# Patient Record
Sex: Male | Born: 1970 | Race: Black or African American | Hispanic: No | Smoking: Never smoker
Health system: Southern US, Community
[De-identification: ages and names within clinical notes are randomized; demographics above are authoritative.]

## PROBLEM LIST (undated history)

## (undated) DIAGNOSIS — J45909 Unspecified asthma, uncomplicated: Secondary | ICD-10-CM

## (undated) DIAGNOSIS — J309 Allergic rhinitis, unspecified: Secondary | ICD-10-CM

## (undated) DIAGNOSIS — E785 Hyperlipidemia, unspecified: Secondary | ICD-10-CM

## (undated) HISTORY — PX: TONSILLECTOMY: SUR1361

## (undated) HISTORY — DX: Hyperlipidemia, unspecified: E78.5

## (undated) HISTORY — PX: OTHER SURGICAL HISTORY: SHX169

## (undated) HISTORY — DX: Allergic rhinitis, unspecified: J30.9

## (undated) HISTORY — DX: Unspecified asthma, uncomplicated: J45.909

---

## 2000-11-06 ENCOUNTER — Emergency Department (HOSPITAL_COMMUNITY): Admission: EM | Admit: 2000-11-06 | Discharge: 2000-11-06 | Payer: Self-pay | Admitting: Emergency Medicine

## 2001-10-28 ENCOUNTER — Emergency Department (HOSPITAL_COMMUNITY): Admission: EM | Admit: 2001-10-28 | Discharge: 2001-10-28 | Payer: Self-pay | Admitting: Emergency Medicine

## 2005-02-13 ENCOUNTER — Emergency Department (HOSPITAL_COMMUNITY): Admission: EM | Admit: 2005-02-13 | Discharge: 2005-02-14 | Payer: Self-pay | Admitting: Emergency Medicine

## 2005-02-15 ENCOUNTER — Ambulatory Visit: Payer: Self-pay | Admitting: Internal Medicine

## 2005-03-04 ENCOUNTER — Ambulatory Visit: Payer: Self-pay | Admitting: Internal Medicine

## 2005-03-11 ENCOUNTER — Ambulatory Visit: Payer: Self-pay | Admitting: Internal Medicine

## 2006-03-01 ENCOUNTER — Ambulatory Visit: Payer: Self-pay | Admitting: Internal Medicine

## 2006-04-25 ENCOUNTER — Ambulatory Visit: Payer: Self-pay | Admitting: Internal Medicine

## 2007-03-06 ENCOUNTER — Ambulatory Visit: Payer: Self-pay | Admitting: Internal Medicine

## 2007-05-07 ENCOUNTER — Ambulatory Visit: Payer: Self-pay | Admitting: Internal Medicine

## 2007-05-07 LAB — CONVERTED CEMR LAB
ALT: 43 units/L (ref 0–53)
Alkaline Phosphatase: 65 units/L (ref 39–117)
BUN: 9 mg/dL (ref 6–23)
Basophils Absolute: 0 10*3/uL (ref 0.0–0.1)
Bilirubin, Direct: 0.1 mg/dL (ref 0.0–0.3)
CO2: 29 meq/L (ref 19–32)
Calcium: 9.3 mg/dL (ref 8.4–10.5)
Chloride: 107 meq/L (ref 96–112)
Creatinine, Ser: 1.1 mg/dL (ref 0.4–1.5)
GFR calc Af Amer: 97 mL/min
Glucose, Bld: 107 mg/dL — ABNORMAL HIGH (ref 70–99)
Hemoglobin: 14.1 g/dL (ref 13.0–17.0)
Lymphocytes Relative: 28.9 % (ref 12.0–46.0)
MCHC: 34.3 g/dL (ref 30.0–36.0)
Monocytes Absolute: 0.6 10*3/uL (ref 0.2–0.7)
Platelets: 214 10*3/uL (ref 150–400)
TSH: 1.93 microintl units/mL (ref 0.35–5.50)
Total Protein: 7.1 g/dL (ref 6.0–8.3)

## 2007-05-08 DIAGNOSIS — E785 Hyperlipidemia, unspecified: Secondary | ICD-10-CM | POA: Insufficient documentation

## 2007-05-08 DIAGNOSIS — J45909 Unspecified asthma, uncomplicated: Secondary | ICD-10-CM

## 2007-05-08 DIAGNOSIS — J302 Other seasonal allergic rhinitis: Secondary | ICD-10-CM

## 2007-05-08 DIAGNOSIS — J309 Allergic rhinitis, unspecified: Secondary | ICD-10-CM

## 2007-05-08 DIAGNOSIS — J3089 Other allergic rhinitis: Secondary | ICD-10-CM

## 2007-05-08 HISTORY — DX: Allergic rhinitis, unspecified: J30.9

## 2007-05-08 HISTORY — DX: Unspecified asthma, uncomplicated: J45.909

## 2007-05-08 HISTORY — DX: Hyperlipidemia, unspecified: E78.5

## 2007-06-23 ENCOUNTER — Encounter: Payer: Self-pay | Admitting: Internal Medicine

## 2007-08-06 ENCOUNTER — Ambulatory Visit: Payer: Self-pay | Admitting: Internal Medicine

## 2007-10-16 ENCOUNTER — Ambulatory Visit: Payer: Self-pay | Admitting: Internal Medicine

## 2007-10-16 ENCOUNTER — Encounter (INDEPENDENT_AMBULATORY_CARE_PROVIDER_SITE_OTHER): Payer: Self-pay | Admitting: *Deleted

## 2007-10-16 DIAGNOSIS — G4710 Hypersomnia, unspecified: Secondary | ICD-10-CM | POA: Insufficient documentation

## 2007-10-29 ENCOUNTER — Telehealth (INDEPENDENT_AMBULATORY_CARE_PROVIDER_SITE_OTHER): Payer: Self-pay | Admitting: *Deleted

## 2007-12-10 ENCOUNTER — Ambulatory Visit: Payer: Self-pay | Admitting: Internal Medicine

## 2007-12-10 DIAGNOSIS — J019 Acute sinusitis, unspecified: Secondary | ICD-10-CM

## 2009-05-29 ENCOUNTER — Ambulatory Visit: Payer: Self-pay | Admitting: Internal Medicine

## 2009-05-29 DIAGNOSIS — L259 Unspecified contact dermatitis, unspecified cause: Secondary | ICD-10-CM | POA: Insufficient documentation

## 2010-12-23 ENCOUNTER — Emergency Department (HOSPITAL_COMMUNITY): Payer: Worker's Compensation

## 2010-12-23 ENCOUNTER — Emergency Department (HOSPITAL_COMMUNITY)
Admission: EM | Admit: 2010-12-23 | Discharge: 2010-12-23 | Disposition: A | Discharge status: Home / Self Care | Payer: Worker's Compensation | Attending: Emergency Medicine | Admitting: Emergency Medicine

## 2010-12-23 DIAGNOSIS — R51 Headache: Secondary | ICD-10-CM | POA: Insufficient documentation

## 2010-12-23 DIAGNOSIS — M545 Low back pain, unspecified: Secondary | ICD-10-CM | POA: Insufficient documentation

## 2010-12-23 DIAGNOSIS — J45909 Unspecified asthma, uncomplicated: Secondary | ICD-10-CM | POA: Insufficient documentation

## 2010-12-23 DIAGNOSIS — K029 Dental caries, unspecified: Secondary | ICD-10-CM | POA: Insufficient documentation

## 2010-12-23 DIAGNOSIS — M79609 Pain in unspecified limb: Secondary | ICD-10-CM | POA: Insufficient documentation

## 2010-12-23 LAB — DIFFERENTIAL
Basophils Absolute: 0 10*3/uL (ref 0.0–0.1)
Eosinophils Relative: 3 % (ref 0–5)
Lymphocytes Relative: 33 % (ref 12–46)
Neutrophils Relative %: 49 % (ref 43–77)

## 2010-12-23 LAB — COMPREHENSIVE METABOLIC PANEL
ALT: 30 U/L (ref 0–53)
AST: 35 U/L (ref 0–37)
Albumin: 4.2 g/dL (ref 3.5–5.2)
CO2: 24 mEq/L (ref 19–32)
GFR calc Af Amer: >60 mL/min (ref >60)
Glucose, Bld: 82 mg/dL (ref 70–99)
Total Bilirubin: 0.6 mg/dL (ref 0.3–1.2)
Total Protein: 7.1 g/dL (ref 6.0–8.3)

## 2010-12-23 LAB — CBC
HCT: 44.8 % (ref 39.0–52.0)
Hemoglobin: 15.3 g/dL (ref 13.0–17.0)
WBC: 5 10*3/uL (ref 4.0–10.5)

## 2010-12-28 ENCOUNTER — Encounter: Payer: Self-pay | Admitting: Internal Medicine

## 2010-12-28 ENCOUNTER — Ambulatory Visit (INDEPENDENT_AMBULATORY_CARE_PROVIDER_SITE_OTHER): Payer: Worker's Compensation | Admitting: Internal Medicine

## 2010-12-28 DIAGNOSIS — J309 Allergic rhinitis, unspecified: Secondary | ICD-10-CM

## 2010-12-28 DIAGNOSIS — R519 Headache, unspecified: Secondary | ICD-10-CM | POA: Insufficient documentation

## 2010-12-28 DIAGNOSIS — R51 Headache: Secondary | ICD-10-CM | POA: Insufficient documentation

## 2010-12-28 DIAGNOSIS — M25519 Pain in unspecified shoulder: Secondary | ICD-10-CM | POA: Insufficient documentation
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
## 2013-02-28 ENCOUNTER — Ambulatory Visit: Payer: Self-pay | Admitting: Internal Medicine

## 2013-03-07 ENCOUNTER — Encounter: Payer: Self-pay | Admitting: Internal Medicine

## 2013-03-07 ENCOUNTER — Ambulatory Visit (INDEPENDENT_AMBULATORY_CARE_PROVIDER_SITE_OTHER): Payer: BC Managed Care – PPO | Admitting: Internal Medicine

## 2013-03-07 VITALS — BP 160/102 | HR 96 | Temp 97.3°F | Wt 249.8 lb

## 2013-03-07 DIAGNOSIS — Z0001 Encounter for general adult medical examination with abnormal findings: Secondary | ICD-10-CM | POA: Insufficient documentation

## 2013-03-07 DIAGNOSIS — J309 Allergic rhinitis, unspecified: Secondary | ICD-10-CM

## 2013-03-07 DIAGNOSIS — J45909 Unspecified asthma, uncomplicated: Secondary | ICD-10-CM

## 2013-03-07 DIAGNOSIS — J019 Acute sinusitis, unspecified: Secondary | ICD-10-CM

## 2013-03-07 DIAGNOSIS — Z0000 Encounter for general adult medical examination without abnormal findings: Secondary | ICD-10-CM

## 2013-03-07 MED ORDER — LEVOFLOXACIN 250 MG PO TABS: 250.0000 mg | ORAL_TABLET | Freq: Every day | ORAL | Status: DC

## 2013-03-07 MED ORDER — FLUTICASONE PROPIONATE 50 MCG/ACT NA SUSP: 2.0000 | Freq: Every day | NASAL | Status: DC

## 2013-03-07 MED ORDER — FEXOFENADINE HCL 180 MG PO TABS: 180.0000 mg | ORAL_TABLET | Freq: Every day | ORAL | Status: DC

## 2013-03-07 MED ORDER — HYDROCODONE-HOMATROPINE 5-1.5 MG/5ML PO SYRP: 5.0000 mL | ORAL_SOLUTION | Freq: Four times a day (QID) | ORAL | Status: DC | PRN

## 2013-03-07 NOTE — Patient Instructions (Addendum)
Please take all new medication as prescribed - the antibiotic and cough medicine, as well as the allegra and flonase Please continue all other medications as before, and refills have been done if requested. You can also take Mucinex (or it's generic off brand) for congestion, and tylenol as needed for pain. Please return in 6 months, or sooner if needed, with Lab testing done 3-5 days before

## 2013-03-07 NOTE — Progress Notes (Signed)
  Subjective:    Patient ID: Jack Carney, male    DOB: 25-Feb-1971, 42 y.o.   MRN: [id number]  HPI   Here with 2-3 days acute onset fever, facial pain, pressure, headache, general weakness and malaise, and greenish d/c, with mild ST and cough, but pt denies chest pain, wheezing, increased sob or doe, orthopnea, PND, increased LE swelling, palpitations, dizziness or syncope.  Does have several wks ongoing nasal allergy symptoms with clearish congestion, itch and sneezing, without fever, pain, ST, cough, swelling or wheezing.  Has not had prior elev BP, but has not been checking elsewhere as well.  Pt denies new neurological symptoms such as new headache, or facial or extremity weakness or numbness   Pt denies polydipsia, polyuria.  Has gained over 10 lbs in the past yr Past Medical History  Diagnosis Date  . ALLERGIC RHINITIS 05/08/2007    Qualifier: Diagnosis of  By: Jonny Ruiz MD, Len Blalock   . ASTHMA 05/08/2007    Qualifier: Diagnosis of  By: Jonny Ruiz MD, Len Blalock   . HYPERLIPIDEMIA 05/08/2007    Qualifier: Diagnosis of  By: Jonny Ruiz MD, Len Blalock    Past Surgical History  Procedure Laterality Date  . Sinus surgury    . Tonsillectomy      reports that he has never smoked. He does not have any smokeless tobacco history on file. He reports that he does not drink alcohol or use illicit drugs. family history includes Hypertension in an unspecified family member. Allergies  Allergen Reactions  . Sulfonamide Derivatives    No current outpatient prescriptions on file prior to visit.   No current facility-administered medications on file prior to visit.   Review of Systems  Constitutional: Negative for unexpected weight change, or unusual diaphoresis  HENT: Negative for tinnitus.   Eyes: Negative for photophobia and visual disturbance.  Respiratory: Negative for choking and stridor.   Gastrointestinal: Negative for vomiting and blood in stool.  Genitourinary: Negative for hematuria and decreased urine  volume.  Musculoskeletal: Negative for acute joint swelling Skin: Negative for color change and wound.  Neurological: Negative for tremors and numbness other than noted  Psychiatric/Behavioral: Negative for decreased concentration or  hyperactivity.       Objective:   Physical Exam BP 160/102  Pulse 96  Temp(Src) 97.3 F (36.3 C) (Oral)  Wt 249 lb 12.8 oz (113.309 kg)  BMI 36.87 kg/m2  SpO2 97% VS noted, mild ill Constitutional: Pt appears well-developed and well-nourished.  HENT: Head: NCAT.  Right Ear: External ear normal.  Left Ear: External ear normal.  Eyes: Conjunctivae and EOM are normal. Pupils are equal, round, and reactive to light.  Bilat tm's with mild erythema.  Max sinus areas mild tender.  Pharynx with mild erythema, no exudate Neck: Normal range of motion. Neck supple. but with bilat submand LA Cardiovascular: Normal rate and regular rhythm.   Pulmonary/Chest: Effort normal and breath sounds normal.  Neurological: Pt is alert. Not confused  Skin: Skin is warm. No erythema.  Psychiatric: Pt behavior is normal. Thought content normal.     Assessment & Plan:

## 2013-03-07 NOTE — Assessment & Plan Note (Signed)
Mod uncontrolled, for allegra and flonase asd,  to f/u any worsening symptoms or concerns

## 2013-03-07 NOTE — Assessment & Plan Note (Signed)
Mild to mod, for antibx course,  to f/u any worsening symptoms or concerns 

## 2013-03-07 NOTE — Assessment & Plan Note (Signed)
stable overall by history and exam, recent data reviewed with pt, and pt to continue medical treatment as before,  to f/u any worsening symptoms or concerns SpO2 Readings from Last 3 Encounters:  03/07/13 97%  12/28/10 96%  05/29/09 96%

## 2015-07-20 ENCOUNTER — Emergency Department (HOSPITAL_COMMUNITY)
Admission: EM | Admit: 2015-07-20 | Discharge: 2015-07-20 | Disposition: A | Discharge status: Home / Self Care | Payer: BC Managed Care – PPO | Attending: Emergency Medicine | Admitting: Emergency Medicine

## 2015-07-20 ENCOUNTER — Telehealth: Payer: Self-pay | Admitting: Internal Medicine

## 2015-07-20 ENCOUNTER — Emergency Department (HOSPITAL_COMMUNITY): Payer: BC Managed Care – PPO

## 2015-07-20 ENCOUNTER — Encounter (HOSPITAL_COMMUNITY): Payer: Self-pay | Admitting: Family Medicine

## 2015-07-20 DIAGNOSIS — H538 Other visual disturbances: Secondary | ICD-10-CM | POA: Diagnosis not present

## 2015-07-20 DIAGNOSIS — J45901 Unspecified asthma with (acute) exacerbation: Secondary | ICD-10-CM | POA: Insufficient documentation

## 2015-07-20 DIAGNOSIS — E785 Hyperlipidemia, unspecified: Secondary | ICD-10-CM | POA: Insufficient documentation

## 2015-07-20 DIAGNOSIS — I10 Essential (primary) hypertension: Secondary | ICD-10-CM

## 2015-07-20 DIAGNOSIS — R030 Elevated blood-pressure reading, without diagnosis of hypertension: Secondary | ICD-10-CM | POA: Diagnosis present

## 2015-07-20 DIAGNOSIS — Z79899 Other long term (current) drug therapy: Secondary | ICD-10-CM | POA: Insufficient documentation

## 2015-07-20 LAB — CBC
HEMATOCRIT: 44.8 % (ref 39.0–52.0)
Hemoglobin: 14.8 g/dL (ref 13.0–17.0)
MCH: 26.5 pg (ref 26.0–34.0)
MCHC: 33 g/dL (ref 30.0–36.0)
MCV: 80.1 fL (ref 78.0–100.0)
Platelets: 194 10*3/uL (ref 150–400)
RBC: 5.59 MIL/uL (ref 4.22–5.81)
RDW: 15 % (ref 11.5–15.5)
WBC: 5.9 10*3/uL (ref 4.0–10.5)

## 2015-07-20 LAB — I-STAT TROPONIN, ED: Troponin i, poc: 0 ng/mL (ref 0.00–0.08)

## 2015-07-20 LAB — BASIC METABOLIC PANEL
Anion gap: 7 (ref 5–15)
BUN: 12 mg/dL (ref 6–20)
CHLORIDE: 108 mmol/L (ref 101–111)
CO2: 24 mmol/L (ref 22–32)
Calcium: 9.6 mg/dL (ref 8.9–10.3)
Creatinine, Ser: 1.24 mg/dL (ref 0.61–1.24)
GFR calc Af Amer: >60 mL/min (ref >60)
GFR calc non Af Amer: >60 mL/min (ref >60)
Glucose, Bld: 90 mg/dL (ref 65–99)
POTASSIUM: 4 mmol/L (ref 3.5–5.1)
SODIUM: 139 mmol/L (ref 135–145)

## 2015-07-20 MED ORDER — LABETALOL HCL 5 MG/ML IV SOLN
10.0000 mg | Freq: Once | INTRAVENOUS | Status: AC
  Administered 2015-07-20: 10 mg via INTRAVENOUS
  Filled 2015-07-20: qty 4

## 2015-07-20 MED ORDER — AMLODIPINE BESYLATE 2.5 MG PO TABS: 2.5000 mg | ORAL_TABLET | Freq: Every day | ORAL | Status: DC

## 2015-07-20 NOTE — Discharge Instructions (Signed)

## 2015-07-20 NOTE — ED Provider Notes (Signed)
CSN: [id number]     Arrival date & time 07/20/15  1121 History   First MD Initiated Contact with Patient 07/20/15 1233     Chief Complaint  Patient presents with  . Hypertension  . Chest Pain  . Shortness of Breath     (Consider location/radiation/quality/duration/timing/severity/associated sxs/prior Treatment) HPI Comments: Patient presents to the emergency department for elevated blood pressure. Patient reports that he has not been feeling well for several weeks. He has checked his blood pressure over the last month 3 or 4 times and it has been elevated every time he has checked it. He has been noticing intermittent blurred vision. He does not have a headache. He is not expressing any chest pain, but has noticed some intermittent shortness of breath.  Patient is a 44 y.o. male presenting with hypertension, chest pain, and shortness of breath.  Hypertension Associated symptoms include shortness of breath.  Chest Pain Associated symptoms: shortness of breath   Shortness of Breath   Past Medical History  Diagnosis Date  . ALLERGIC RHINITIS 05/08/2007    Qualifier: Diagnosis of  By: Jonny Ruiz MD, Len Blalock   . ASTHMA 05/08/2007    Qualifier: Diagnosis of  By: Jonny Ruiz MD, Len Blalock   . HYPERLIPIDEMIA 05/08/2007    Qualifier: Diagnosis of  By: Jonny Ruiz MD, Len Blalock    Past Surgical History  Procedure Laterality Date  . Sinus surgury    . Tonsillectomy     Family History  Problem Relation Age of Onset  . Hypertension     Social History  Substance Use Topics  . Smoking status: Never Smoker   . Smokeless tobacco: None  . Alcohol Use: No    Review of Systems  Eyes: Positive for visual disturbance.  Respiratory: Positive for shortness of breath.   All other systems reviewed and are negative.     Allergies  Sulfonamide derivatives  Home Medications   Prior to Admission medications   Medication Sig Start Date End Date Taking? Authorizing Provider  albuterol (PROVENTIL HFA;VENTOLIN HFA)  108 (90 BASE) MCG/ACT inhaler Inhale 1-2 puffs into the lungs every 6 (six) hours as needed for wheezing or shortness of breath.   Yes Historical Provider, MD  cetirizine (ZYRTEC) 10 MG tablet Take 10 mg by mouth daily.   Yes Historical Provider, MD  cholecalciferol (VITAMIN D) 1000 UNITS tablet Take 1,000 Units by mouth daily.   Yes Historical Provider, MD  Cyanocobalamin (B-12 PO) Take 1 tablet by mouth daily.   Yes Historical Provider, MD  ibuprofen (ADVIL,MOTRIN) 200 MG tablet Take 400-600 mg by mouth every 6 (six) hours as needed for moderate pain.   Yes Historical Provider, MD  Sildenafil Citrate (VIAGRA PO) Take 1 tablet by mouth as needed (erectile dysfunction).   Yes Historical Provider, MD   BP 130/86 mmHg  Pulse 63  Temp(Src) 97.6 F (36.4 C) (Oral)  Resp 13  SpO2 100% Physical Exam  Constitutional: He is oriented to person, place, and time. He appears well-developed and well-nourished. No distress.  HENT:  Head: Normocephalic and atraumatic.  Right Ear: Hearing normal.  Left Ear: Hearing normal.  Nose: Nose normal.  Mouth/Throat: Oropharynx is clear and moist and mucous membranes are normal.  Eyes: Conjunctivae and EOM are normal. Pupils are equal, round, and reactive to light.  Neck: Normal range of motion. Neck supple.  Cardiovascular: Regular rhythm, S1 normal and S2 normal.  Exam reveals no gallop and no friction rub.   No murmur heard. Pulmonary/Chest: Effort normal  and breath sounds normal. No respiratory distress. He exhibits no tenderness.  Abdominal: Soft. Normal appearance and bowel sounds are normal. There is no hepatosplenomegaly. There is no tenderness. There is no rebound, no guarding, no tenderness at McBurney's point and negative Murphy's sign. No hernia.  Musculoskeletal: Normal range of motion.  Neurological: He is alert and oriented to person, place, and time. He has normal strength. No cranial nerve deficit or sensory deficit. Coordination normal. GCS eye  subscore is 4. GCS verbal subscore is 5. GCS motor subscore is 6.  Skin: Skin is warm, dry and intact. No rash noted. No cyanosis.  Psychiatric: He has a normal mood and affect. His speech is normal and behavior is normal. Thought content normal.  Nursing note and vitals reviewed.   ED Course  Procedures (including critical care time) Labs Review Labs Reviewed  BASIC METABOLIC PANEL  CBC  I-STAT TROPOININ, ED    Imaging Review Dg Chest 2 View  07/20/2015   CLINICAL DATA:  Shortness of breath with dizziness and visual problems.  EXAM: CHEST  2 VIEW  COMPARISON:  12/23/2010  FINDINGS: Lungs are adequately inflated without consolidation or effusion. Cardiomediastinal silhouette and remainder of the exam is unchanged.  IMPRESSION: No active cardiopulmonary disease.   Electronically Signed   By: Elberta Fortis M.D.   On: 07/20/2015 13:45   Ct Head Wo Contrast  07/20/2015   CLINICAL DATA:  Headache with blurred vision ; hypertension  EXAM: CT HEAD WITHOUT CONTRAST  TECHNIQUE: Contiguous axial images were obtained from the base of the skull through the vertex without intravenous contrast.  COMPARISON:  December 23, 2010  FINDINGS: The ventricles are normal in size and configuration. There is no intracranial mass, hemorrhage, extra-axial fluid collection, or midline shift. The gray-white compartments are normal. No demonstrable acute infarct. Bony calvarium appears intact. The mastoid air cells are clear. There is mucosal thickening in the right maxillary antrum. There is opacification of several ethmoid air cells bilaterally. Visualized orbits appear symmetric and normal bilaterally.  IMPRESSION: Areas of paranasal sinus disease. Study otherwise unremarkable. In particular, no evidence of intracranial mass, hemorrhage, or focal gray - white compartment lesions/acute appearing infarct.   Electronically Signed   By: Bretta Bang III M.D.   On: 07/20/2015 13:44   I have personally reviewed and evaluated  these images and lab results as part of my medical decision-making.   EKG Interpretation None      MDM   Final diagnoses:  None  hypertension  Presents to the ER primarily for evaluation of hypertension. Patient has had multiple elevated blood pressures over the last month. He does not have a previous history of hypertension. Patient's evaluation today is unremarkable. Blood pressure has been elevated, but he has improved with treatment. He is not expressing any shortness of breath currently. Lungs are clear. Chest x-ray was unremarkable. Patient also had a CT head performed because he has been experiencing blurred vision and mild dizziness with elevated blood pressure. CT did not show any acute abnormality. Kidney function is normal. Patient reports multiple abnormal pressures, will require treatment. Will initiate on Norvasc, follow-up with primary doctor for recheck in one week.    Gilda Crease, MD 07/20/15 [phone]

## 2015-07-20 NOTE — ED Notes (Signed)
Pt sts he hasn't been feeling well since Saturday. sts checked his BP this am and was 190/130. sts dizziness, vision problems. sts family hx of heart problems.

## 2015-07-20 NOTE — Telephone Encounter (Signed)
Patient Name: Jack Carney DOB: [birth year] Initial Comment Caller states he has high BP. It is 190/131 Nurse Assessment Nurse: Yetta Barre, RN, Miranda Date/Time (Eastern Time): 07/20/2015 9:53:30 AM Confirm and document reason for call. If symptomatic, describe symptoms. ---Caller states his BP is 190/131 today. Has the patient traveled out of the country within the last 30 days? ---Not Applicable Does the patient require triage? ---Yes Related visit to physician within the last 2 weeks? ---No Does the PT have any chronic conditions? (i.e. diabetes, asthma, etc.) ---Yes List chronic conditions. ---Allergies Guidelines Guideline Title Affirmed Question Affirmed Notes High Blood Pressure [1] BP # 160 / 100 AND [2] cardiac or neurologic symptoms (e.g., chest pain, difficulty breathing, unsteady gait, blurred vision) Final Disposition User Go to ED Now Yetta Barre, RN, Miranda Referrals [facility] - ED Disagree/Comply: Comply

## 2015-07-21 ENCOUNTER — Ambulatory Visit: Payer: Self-pay | Admitting: Internal Medicine

## 2015-11-22 ENCOUNTER — Other Ambulatory Visit: Payer: Self-pay | Admitting: Internal Medicine

## 2017-08-23 ENCOUNTER — Telehealth: Payer: Self-pay | Admitting: Internal Medicine

## 2017-08-23 NOTE — Telephone Encounter (Signed)
Pt called asking for an appointment with you for a blood pressure medication refill. He was last seen on 03/07/2013. Would you be okay seeing him to re-establish care?

## 2017-08-23 NOTE — Telephone Encounter (Signed)
Ok with me 

## 2017-08-25 NOTE — Telephone Encounter (Signed)
LM letting pt know °

## 2017-10-18 ENCOUNTER — Encounter: Payer: Self-pay | Admitting: Internal Medicine

## 2017-10-18 ENCOUNTER — Ambulatory Visit: Payer: BC Managed Care – PPO | Admitting: Internal Medicine

## 2017-10-18 VITALS — BP 140/88 | HR 83 | Temp 97.9°F | Ht 69.0 in | Wt 255.0 lb

## 2017-10-18 DIAGNOSIS — R5383 Other fatigue: Secondary | ICD-10-CM | POA: Diagnosis not present

## 2017-10-18 DIAGNOSIS — E785 Hyperlipidemia, unspecified: Secondary | ICD-10-CM

## 2017-10-18 DIAGNOSIS — Z114 Encounter for screening for human immunodeficiency virus [HIV]: Secondary | ICD-10-CM

## 2017-10-18 DIAGNOSIS — Z0000 Encounter for general adult medical examination without abnormal findings: Secondary | ICD-10-CM | POA: Diagnosis not present

## 2017-10-18 DIAGNOSIS — E559 Vitamin D deficiency, unspecified: Secondary | ICD-10-CM

## 2017-10-18 DIAGNOSIS — E538 Deficiency of other specified B group vitamins: Secondary | ICD-10-CM

## 2017-10-18 DIAGNOSIS — G4710 Hypersomnia, unspecified: Secondary | ICD-10-CM | POA: Diagnosis not present

## 2017-10-18 MED ORDER — AMLODIPINE BESYLATE 2.5 MG PO TABS: 2.5000 mg | ORAL_TABLET | Freq: Every day | ORAL | 3 refills | Status: DC

## 2017-10-18 NOTE — Assessment & Plan Note (Signed)
With symptoms, for pulm referral - r/o OSA

## 2017-10-18 NOTE — Progress Notes (Signed)
   Subjective:    Patient ID: Jack Carney, male    DOB: May 11, 1971, 46 y.o.   MRN: [id number]  HPI  Here to f/u; overall doing ok,  Pt denies chest pain, increasing sob or doe, wheezing, orthopnea, PND, increased LE swelling, palpitations, dizziness or syncope.  Pt denies new neurological symptoms such as new headache, or facial or extremity weakness or numbness.  Pt denies polydipsia, polyuria, or low sugar episode.  Pt states overall good compliance with meds, mostly trying to follow appropriate diet, with wt overall stable,  but little exercise however.Taking zyrtec D children which really helped the sinus, but BP was elevated.  Has gained significant wt.   Wt Readings from Last 3 Encounters:  10/18/17 255 lb (115.7 kg)  03/07/13 249 lb 12.8 oz (113.3 kg)  12/28/10 (!) 233 lb 4 oz (105.8 kg)  Does c/o ongoing fatigue, but also signficant daytime hypersomnolence. Past Medical History:  Diagnosis Date  . ALLERGIC RHINITIS 05/08/2007   Qualifier: Diagnosis of  By: Jonny RuizJohn MD, Len BlalockJames W   . ASTHMA 05/08/2007   Qualifier: Diagnosis of  By: Jonny RuizJohn MD, Len BlalockJames W   . HYPERLIPIDEMIA 05/08/2007   Qualifier: Diagnosis of  By: Jonny RuizJohn MD, Len BlalockJames W    Past Surgical History:  Procedure Laterality Date  . sinus surgury    . TONSILLECTOMY      reports that  has never smoked. he has never used smokeless tobacco. He reports that he does not drink alcohol or use drugs. family history includes Hypertension in his unknown relative. Allergies  Allergen Reactions  . Sulfonamide Derivatives Nausea And Vomiting    Stomach pains   Current Outpatient Medications on File Prior to Visit  Medication Sig Dispense Refill  . albuterol (PROVENTIL HFA;VENTOLIN HFA) 108 (90 BASE) MCG/ACT inhaler Inhale 1-2 puffs into the lungs every 6 (six) hours as needed for wheezing or shortness of breath.     No current facility-administered medications on file prior to visit.   ROS:  Constitutional: Negative for other unusual  diaphoresis or sweats HENT: Negative for ear discharge or swelling Eyes: Negative for other worsening visual disturbances Respiratory: Negative for stridor or other swelling  Gastrointestinal: Negative for worsening distension or other blood Genitourinary: Negative for retention or other urinary change Musculoskeletal: Negative for other MSK pain or swelling Skin: Negative for color change or other new lesions Neurological: Negative for worsening tremors and other numbness  Psychiatric/Behavioral: Negative for worsening agitation or other fatigue All other system neg per pt    Objective:   Physical Exam BP 140/88   Pulse 83   Temp 97.9 F (36.6 C) (Oral)   Ht 5\' 9"  (1.753 m)   Wt 255 lb (115.7 kg)   SpO2 98%   BMI 37.66 kg/m  VS noted, morbid obese Constitutional: Pt appears in NAD HENT: Head: NCAT.  Right Ear: External ear normal.  Left Ear: External ear normal.  Eyes: . Pupils are equal, round, and reactive to light. Conjunctivae and EOM are normal Nose: without d/c or deformity Neck: Neck supple. Gross normal ROM Cardiovascular: Normal rate and regular rhythm.   Pulmonary/Chest: Effort normal and breath sounds without rales or wheezing.  Abd:  Soft, NT, ND, + BS, no organomegaly Neurological: Pt is alert. At baseline orientation, motor grossly intact Skin: Skin is warm. No rashes, other new lesions, no LE edema Psychiatric: Pt behavior is normal without agitation  No other eam findings    Assessment & Plan:

## 2017-10-18 NOTE — Assessment & Plan Note (Signed)
No recent evaluation, for lower chol diet, goal < 100, declines lab today but plans to return in 3 mo next visit with labs prior

## 2017-10-18 NOTE — Assessment & Plan Note (Signed)
D/w pt, for lower calories, restart exercise daily 30 min,  to f/u any worsening symptoms or concerns

## 2017-10-18 NOTE — Patient Instructions (Signed)
Please continue all other medications as before, and refills have been done if requested.  Please have the pharmacy call with any other refills you may need.  Please continue your efforts at being more active, low cholesterol diet, and weight control.  You are otherwise up to date with prevention measures today.  Please keep your appointments with your specialists as you may have planned  You will be contacted regarding the referral for: Pulmonary  Please return in 3 months, or sooner if needed, with Lab testing done 3-5 days before

## 2017-11-03 ENCOUNTER — Encounter: Payer: Self-pay | Admitting: Internal Medicine

## 2017-11-03 ENCOUNTER — Ambulatory Visit (INDEPENDENT_AMBULATORY_CARE_PROVIDER_SITE_OTHER): Payer: BC Managed Care – PPO | Admitting: Internal Medicine

## 2017-11-03 VITALS — BP 114/72 | HR 89 | Ht 69.0 in | Wt 256.4 lb

## 2017-11-03 DIAGNOSIS — J302 Other seasonal allergic rhinitis: Secondary | ICD-10-CM | POA: Diagnosis not present

## 2017-11-03 DIAGNOSIS — G4733 Obstructive sleep apnea (adult) (pediatric): Secondary | ICD-10-CM

## 2017-11-03 DIAGNOSIS — G4710 Hypersomnia, unspecified: Secondary | ICD-10-CM | POA: Diagnosis not present

## 2017-11-03 DIAGNOSIS — J3089 Other allergic rhinitis: Secondary | ICD-10-CM

## 2017-11-03 NOTE — Patient Instructions (Signed)
Order- schedule unattended home sleep test    Dx OSA   Please call me about 2 weeks after your sleep study for results and recommendations. We may be able to start treatment, if appropriate before I see you back.

## 2017-11-03 NOTE — Progress Notes (Signed)
11/03/17-47 year old male never smoker for sleep evaluation.  Medical problem list includes allergic rhinitis, asthma, hypersomnia, morbid obesity, HBP Epworth score 24 He is here with girlfriend who supports his history he snores loudly and sleep is restless with frequent waking, tossing and turning.  Some daytime sleepiness.  No caffeine or sleep medicines.  Has worked 2 and 3 jobs much of his adult life, currently working detailing automobiles.  No nighttime or rotating shift work. They deny parasomnias. ENT surgery limited to tonsils and adenoids.  Treated for asthma.  Not aware of heart problems.  Seasonal and perennial allergic rhinitis but dislikes Flonase because it seemed to bother his eyesight.  Prior to Admission medications   Medication Sig Start Date End Date Taking? Authorizing Provider  albuterol (PROVENTIL HFA;VENTOLIN HFA) 108 (90 BASE) MCG/ACT inhaler Inhale 1-2 puffs into the lungs every 6 (six) hours as needed for wheezing or shortness of breath.   Yes [provider]  amLODipine (NORVASC) 2.5 MG tablet Take 1 tablet (2.5 mg total) by mouth daily. 10/18/17  Yes Corwin LevinsJohn, James W, MD   Past Medical History:  Diagnosis Date  . ALLERGIC RHINITIS 05/08/2007   Qualifier: Diagnosis of  By: Jonny RuizJohn MD, Len BlalockJames W   . ASTHMA 05/08/2007   Qualifier: Diagnosis of  By: Jonny RuizJohn MD, Len BlalockJames W   . HYPERLIPIDEMIA 05/08/2007   Qualifier: Diagnosis of  By: Jonny RuizJohn MD, Len BlalockJames W    Past Surgical History:  Procedure Laterality Date  . sinus surgury    . TONSILLECTOMY     Family History  Problem Relation Age of Onset  . Hypertension Unknown    Social History   Socioeconomic History  . Marital status: Married    Spouse name: Not on file  . Number of children: Not on file  . Years of education: Not on file  . Highest education level: Not on file  Social Needs  . Financial resource strain: Not on file  . Food insecurity - worry: Not on file  . Food insecurity - inability: Not on file  .  Transportation needs - medical: Not on file  . Transportation needs - non-medical: Not on file  Occupational History  . Not on file  Tobacco Use  . Smoking status: Never Smoker  . Smokeless tobacco: Never Used  Substance and Sexual Activity  . Alcohol use: No  . Drug use: No  . Sexual activity: Not on file  Other Topics Concern  . Not on file  Social History Narrative  . Not on file   ROS-see HPI    + = positive Constitutional:    weight loss, night sweats, fevers, chills, +fatigue, lassitude. HEENT:    headaches, difficulty swallowing, tooth/dental problems, sore throat,       sneezing, itching, ear ache, nasal congestion, post nasal drip, snoring CV:    chest pain, orthopnea, PND, swelling in lower extremities, anasarca,                                                     dizziness, palpitations Resp:   shortness of breath with exertion or at rest.                productive cough,   non-productive cough, coughing up of blood.              change in  color of mucus.  wheezing.   Skin:    rash or lesions. GI:  No-   heartburn, indigestion, abdominal pain, nausea, vomiting, diarrhea,                 change in bowel habits, loss of appetite GU: dysuria, change in color of urine, no urgency or frequency.   flank pain. MS:   joint pain, stiffness, decreased range of motion, back pain. Neuro-     nothing unusual Psych:  change in mood or affect.  depression or anxiety.   memory loss.  OBJ- Physical Exam General- Alert, Oriented, Affect-appropriate, Distress- none acute, + overweight  Skin- rash-none, lesions- none, excoriation- none Lymphadenopathy- none Head- atraumatic            Eyes- Gross vision intact, PERRLA, conjunctivae and secretions clear            Ears- Hearing, canals-normal            Nose- Clear, no-Septal dev, mucus, polyps, erosion, perforation             Throat- Mallampati IV , mucosa clear , drainage- none, tonsils- atrophic Neck- flexible , trachea midline,  no stridor , thyroid nl, carotid no bruit Chest - symmetrical excursion , unlabored           Heart/CV- RRR , no murmur , no gallop  , no rub, nl s1 s2                           - JVD- none , edema- none, stasis changes- none, varices- none           Lung- clear to P&A, wheeze- none, cough- none , dullness-none, rub- none           Chest wall-  Abd-  Br/ Gen/ Rectal- Not done, not indicated Extrem- cyanosis- none, clubbing, none, atrophy- none, strength- nl Neuro- grossly intact to observation

## 2017-11-03 NOTE — Assessment & Plan Note (Signed)
We discussed use of antihistamines and decongestants.  Not sure how Flonase might have affected his vision.  He might be a candidate for Nasalcrom.

## 2017-11-03 NOTE — Assessment & Plan Note (Signed)
High probability that this is obstructive sleep apnea based on history and physical exam.  Appropriate educational discussion done including consideration of weight, responsibility to drive safely, medical concerns and treatment options. Plan-schedule sleep study.  He will call for results, anticipating we may start CPAP if appropriate before his return.

## 2017-11-03 NOTE — Assessment & Plan Note (Signed)
Importance of seeking normal body weight in context of his medical comorbidities.

## 2017-11-20 DIAGNOSIS — G4733 Obstructive sleep apnea (adult) (pediatric): Secondary | ICD-10-CM | POA: Diagnosis not present

## 2017-11-21 DIAGNOSIS — G4733 Obstructive sleep apnea (adult) (pediatric): Secondary | ICD-10-CM | POA: Diagnosis not present

## 2017-11-24 ENCOUNTER — Other Ambulatory Visit: Payer: Self-pay | Admitting: *Deleted

## 2017-11-24 DIAGNOSIS — G4733 Obstructive sleep apnea (adult) (pediatric): Secondary | ICD-10-CM

## 2017-12-04 ENCOUNTER — Encounter: Payer: Self-pay | Admitting: Internal Medicine

## 2017-12-04 ENCOUNTER — Ambulatory Visit: Payer: BC Managed Care – PPO | Admitting: Internal Medicine

## 2017-12-04 ENCOUNTER — Emergency Department (HOSPITAL_COMMUNITY): Payer: BC Managed Care – PPO

## 2017-12-04 ENCOUNTER — Emergency Department (HOSPITAL_COMMUNITY)
Admission: EM | Admit: 2017-12-04 | Discharge: 2017-12-04 | Disposition: A | Discharge status: Home / Self Care | Payer: BC Managed Care – PPO | Attending: Emergency Medicine | Admitting: Emergency Medicine

## 2017-12-04 ENCOUNTER — Encounter (HOSPITAL_COMMUNITY): Payer: Self-pay | Admitting: Family Medicine

## 2017-12-04 DIAGNOSIS — J45909 Unspecified asthma, uncomplicated: Secondary | ICD-10-CM | POA: Insufficient documentation

## 2017-12-04 DIAGNOSIS — Z79899 Other long term (current) drug therapy: Secondary | ICD-10-CM | POA: Insufficient documentation

## 2017-12-04 DIAGNOSIS — K429 Umbilical hernia without obstruction or gangrene: Secondary | ICD-10-CM | POA: Diagnosis not present

## 2017-12-04 DIAGNOSIS — N200 Calculus of kidney: Secondary | ICD-10-CM | POA: Insufficient documentation

## 2017-12-04 DIAGNOSIS — K449 Diaphragmatic hernia without obstruction or gangrene: Secondary | ICD-10-CM | POA: Insufficient documentation

## 2017-12-04 DIAGNOSIS — R1031 Right lower quadrant pain: Secondary | ICD-10-CM

## 2017-12-04 DIAGNOSIS — K4090 Unilateral inguinal hernia, without obstruction or gangrene, not specified as recurrent: Secondary | ICD-10-CM

## 2017-12-04 LAB — COMPREHENSIVE METABOLIC PANEL
ALBUMIN: 4.2 g/dL (ref 3.5–5.0)
ALT: 34 U/L (ref 17–63)
AST: 24 U/L (ref 15–41)
Alkaline Phosphatase: 71 U/L (ref 38–126)
Anion gap: 10 (ref 5–15)
BUN: 18 mg/dL (ref 6–20)
CHLORIDE: 107 mmol/L (ref 101–111)
CO2: 22 mmol/L (ref 22–32)
Calcium: 9.1 mg/dL (ref 8.9–10.3)
Creatinine, Ser: 1.33 mg/dL — ABNORMAL HIGH (ref 0.61–1.24)
GFR calc Af Amer: >60 mL/min (ref >60)
Glucose, Bld: 96 mg/dL (ref 65–99)
POTASSIUM: 3.8 mmol/L (ref 3.5–5.1)
Sodium: 139 mmol/L (ref 135–145)
Total Bilirubin: 0.7 mg/dL (ref 0.3–1.2)
Total Protein: 8 g/dL (ref 6.5–8.1)

## 2017-12-04 LAB — URINALYSIS, ROUTINE W REFLEX MICROSCOPIC
BILIRUBIN URINE: NEGATIVE
Glucose, UA: NEGATIVE mg/dL
Hgb urine dipstick: NEGATIVE
KETONES UR: NEGATIVE mg/dL
Nitrite: NEGATIVE
PROTEIN: NEGATIVE mg/dL
Specific Gravity, Urine: 1.026 (ref 1.005–1.030)
pH: 6 (ref 5.0–8.0)

## 2017-12-04 LAB — CBC
HEMATOCRIT: 43.2 % (ref 39.0–52.0)
HEMOGLOBIN: 14.2 g/dL (ref 13.0–17.0)
MCH: 26.1 pg (ref 26.0–34.0)
MCHC: 32.9 g/dL (ref 30.0–36.0)
MCV: 79.4 fL (ref 78.0–100.0)
Platelets: 215 10*3/uL (ref 150–400)
RBC: 5.44 MIL/uL (ref 4.22–5.81)
RDW: 14.8 % (ref 11.5–15.5)
WBC: 6.5 10*3/uL (ref 4.0–10.5)

## 2017-12-04 LAB — LIPASE, BLOOD: LIPASE: 36 U/L (ref 11–51)

## 2017-12-04 MED ORDER — IOPAMIDOL (ISOVUE-300) INJECTION 61%
INTRAVENOUS | Status: AC
  Filled 2017-12-04: qty 100

## 2017-12-04 MED ORDER — IOPAMIDOL (ISOVUE-300) INJECTION 61%
100.0000 mL | Freq: Once | INTRAVENOUS | Status: AC | PRN
  Administered 2017-12-04: 100 mL via INTRAVENOUS

## 2017-12-04 NOTE — Assessment & Plan Note (Addendum)
I am worried that the patient is suffering with incarcerated small inguinal hernia on the right.  In the beginning of the visit he was reluctant to go to the ER now in spite of feeling very poorly.  I ordered lab work and a CT scan for tomorrow. However, after further discussion the patient agreed to go to the ER to speed up the diagnostic process, get a CT, have a pain relief and see a surgeon if needed. He is in the substantial abdominal/right groin discomfort now.   FTF>20 min discussing his situation.

## 2017-12-04 NOTE — Discharge Instructions (Signed)
Take tylenol or motrin for pain. Avoid heavy lifting. Please follow up with [facility]

## 2017-12-04 NOTE — Progress Notes (Signed)
Subjective:  Patient ID: Jack Carney, male    DOB: Jan 31, 1971  Age: 47 y.o. MRN: [id number]  CC: No chief complaint on file.   HPI Jack Carney presents for abd pain around his bellybutton on the right and radiating down to his right testicle. 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 2 days after the pain got worse. Pain is 7-8/10. Needs to urinate a lot. Pain irrad to testicle and it is 7-10 out of 10 at times.  He has not slept well over the past several nights.  Today he felt nauseated.  There is no constipation.  He has been able to eat.  He has been taking a lot of Aleve and Tylenol to pain.  Outpatient Medications Prior to Visit  Medication Sig Dispense Refill  . albuterol (PROVENTIL HFA;VENTOLIN HFA) 108 (90 BASE) MCG/ACT inhaler Inhale 1-2 puffs into the lungs every 6 (six) hours as needed for wheezing or shortness of breath.    Marland Kitchen. amLODipine (NORVASC) 2.5 MG tablet Take 1 tablet (2.5 mg total) by mouth daily. 90 tablet 3   No facility-administered medications prior to visit.     ROS Review of Systems  Constitutional: Negative for appetite change, fatigue and unexpected weight change.  HENT: Negative for congestion, nosebleeds, sneezing, sore throat and trouble swallowing.   Eyes: Negative for itching and visual disturbance.  Respiratory: Negative for cough.   Cardiovascular: Negative for chest pain, palpitations and leg swelling.  Gastrointestinal: Positive for abdominal pain and nausea. Negative for abdominal distention, blood in stool, constipation, diarrhea and vomiting.  Genitourinary: Positive for testicular pain. Negative for frequency and hematuria.  Musculoskeletal: Negative for back pain, gait problem, joint swelling and neck pain.  Skin: Negative for rash.  Neurological: Negative for dizziness, tremors, speech difficulty and weakness.  Psychiatric/Behavioral: Negative for agitation, dysphoric mood and  sleep disturbance. The patient is not nervous/anxious.     Objective:  BP 136/84 (BP Location: Left Arm, Patient Position: Sitting, Cuff Size: Large)   Pulse 71   Temp 97.8 F (36.6 C) (Oral)   Ht 5\' 9"  (1.753 m)   Wt 258 lb (117 kg)   SpO2 99%   BMI 38.10 kg/m   BP Readings from Last 3 Encounters:  12/04/17 136/84  11/03/17 114/72  10/18/17 140/88    Wt Readings from Last 3 Encounters:  12/04/17 258 lb (117 kg)  11/03/17 256 lb 6.4 oz (116.3 kg)  10/18/17 255 lb (115.7 kg)    Physical Exam  Constitutional: He is oriented to person, place, and time. He appears well-developed. No distress.  NAD  HENT:  Mouth/Throat: Oropharynx is clear and moist.  Eyes: Conjunctivae are normal. Pupils are equal, round, and reactive to light.  Neck: Normal range of motion. No JVD present. No thyromegaly present.  Cardiovascular: Normal rate, regular rhythm, normal heart sounds and intact distal pulses. Exam reveals no gallop and no friction rub.  No murmur heard. Pulmonary/Chest: Effort normal and breath sounds normal. No respiratory distress. He has no wheezes. He has no rales. He exhibits no tenderness.  Abdominal: Soft. Bowel sounds are normal. He exhibits no distension and no mass. There is no tenderness. There is no rebound and no guarding.  Musculoskeletal: Normal range of motion. He exhibits no edema or tenderness.  Lymphadenopathy:    He has no cervical adenopathy.  Neurological: He is alert and oriented to person, place, and time.  He has normal reflexes. No cranial nerve deficit. He exhibits normal muscle tone. He displays a negative Romberg sign. Coordination and gait normal.  Skin: Skin is warm and dry. No rash noted.  Psychiatric: He has a normal mood and affect. His behavior is normal. Judgment and thought content normal.  Abdomen is large and nontender standing or supine.  There are no masses felt.  Left testicle is atrophic.  Right testicle is normal and descended nontender.   Right inguinal area palpation reveals a small tender hernia.  There is no rash.  The patient is grimacing when with pain when he is moving around, getting up off the chair etc.  He is in visible distress with pain.  Lab Results  Component Value Date   WBC 5.9 07/20/2015   HGB 14.8 07/20/2015   HCT 44.8 07/20/2015   PLT 194 07/20/2015   GLUCOSE 90 07/20/2015   ALT 30 12/23/2010   AST 35 12/23/2010   NA 139 07/20/2015   K 4.0 07/20/2015   CL 108 07/20/2015   CREATININE 1.24 07/20/2015   BUN 12 07/20/2015   CO2 24 07/20/2015   TSH 1.93 05/07/2007    Dg Chest 2 View  Result Date: 07/20/2015 CLINICAL DATA:  Shortness of breath with dizziness and visual problems. EXAM: CHEST  2 VIEW COMPARISON:  12/23/2010 FINDINGS: Lungs are adequately inflated without consolidation or effusion. Cardiomediastinal silhouette and remainder of the exam is unchanged. IMPRESSION: No active cardiopulmonary disease. Electronically Signed   By: Elberta Fortis M.D.   On: 07/20/2015 13:45   Ct Head Wo Contrast  Result Date: 07/20/2015 CLINICAL DATA:  Headache with blurred vision ; hypertension EXAM: CT HEAD WITHOUT CONTRAST TECHNIQUE: Contiguous axial images were obtained from the base of the skull through the vertex without intravenous contrast. COMPARISON:  December 23, 2010 FINDINGS: The ventricles are normal in size and configuration. There is no intracranial mass, hemorrhage, extra-axial fluid collection, or midline shift. The gray-white compartments are normal. No demonstrable acute infarct. Bony calvarium appears intact. The mastoid air cells are clear. There is mucosal thickening in the right maxillary antrum. There is opacification of several ethmoid air cells bilaterally. Visualized orbits appear symmetric and normal bilaterally. IMPRESSION: Areas of paranasal sinus disease. Study otherwise unremarkable. In particular, no evidence of intracranial mass, hemorrhage, or focal gray - white compartment lesions/acute  appearing infarct. Electronically Signed   By: Bretta Bang III M.D.   On: 07/20/2015 13:44    Assessment & Plan:   There are no diagnoses linked to this encounter. I am having Jack Igo maintain his albuterol and amLODipine.  No orders of the defined types were placed in this encounter.    Follow-up: No Follow-up on file.  Sonda Primes, MD

## 2017-12-04 NOTE — ED Provider Notes (Signed)
[facility]-EMERGENCY DEPT Provider Note   CSN: [id number] Arrival date & time: 12/04/17  1638     History   Chief Complaint Chief Complaint  Patient presents with  . Groin Pain    HPI Jack Carney is a 47 y.o. male.  HPI Jack Carney is a 47 y.o. male presents emergency department complaining of right lower side abdominal pain.  Patient states that he felt severe pain after working out one day.  He states when he went to work and tried to lift a garage door, his pain has gotten worse.  He reports pain in the right groin that is worsened with any strenuous movement.  He denies any known injuries.  He describes pain as sharp, shooting into his right scrotum.  He denies scrotal tenderness or swelling.  Denies any urinary symptoms.  Denies fever or chills.  He reports normal bowel movements.  He reports some nausea, denies any vomiting.  He went to primary care doctor today who was concerned about possible incarcerated inguinal hernia, and was sent here.    Past Medical History:  Diagnosis Date  . ALLERGIC RHINITIS 05/08/2007   Qualifier: Diagnosis of  By: Jonny RuizJohn MD, Len BlalockJames W   . ASTHMA 05/08/2007   Qualifier: Diagnosis of  By: Jonny RuizJohn MD, Len BlalockJames W   . HYPERLIPIDEMIA 05/08/2007   Qualifier: Diagnosis of  By: Jonny RuizJohn MD, Len BlalockJames W     Patient Active Problem List   Diagnosis Date Noted  . Inguinal pain, right 12/04/2017  . Acute sinus infection 03/07/2013  . Routine general medical examination at a health care facility 03/07/2013  . HYPERSOMNIA 10/16/2007  . Hyperlipidemia 05/08/2007  . Morbid obesity (HCC) 05/08/2007  . Seasonal and perennial allergic rhinitis 05/08/2007  . ASTHMA 05/08/2007    Past Surgical History:  Procedure Laterality Date  . sinus surgury    . TONSILLECTOMY         Home Medications    Prior to Admission medications   Medication Sig Start Date End Date Taking? Authorizing Provider  amLODipine (NORVASC) 2.5 MG tablet Take 1 tablet  (2.5 mg total) by mouth daily. 10/18/17  Yes Corwin LevinsJohn, James W, MD    Family History Family History  Problem Relation Age of Onset  . Hypertension Unknown     Social History Social History   Tobacco Use  . Smoking status: Never Smoker  . Smokeless tobacco: Never Used  Substance Use Topics  . Alcohol use: No  . Drug use: No     Allergies   Sulfonamide derivatives   Review of Systems Review of Systems  Constitutional: Negative for chills and fever.  Respiratory: Negative for cough, chest tightness and shortness of breath.   Cardiovascular: Negative for chest pain, palpitations and leg swelling.  Gastrointestinal: Positive for abdominal pain and nausea. Negative for abdominal distention, diarrhea and vomiting.  Genitourinary: Positive for testicular pain. Negative for difficulty urinating, dysuria, frequency, hematuria, penile pain, penile swelling, scrotal swelling and urgency.  Musculoskeletal: Negative for arthralgias, myalgias, neck pain and neck stiffness.  Skin: Negative for rash.  Allergic/Immunologic: Negative for immunocompromised state.  Neurological: Negative for dizziness, weakness, light-headedness, numbness and headaches.  All other systems reviewed and are negative.    Physical Exam Updated Vital Signs BP (!) 163/110 (BP Location: Left Arm)   Pulse 76   Temp 98.2 F (36.8 C) (Oral)   Resp 18   Ht 5\' 9"  (1.753 m)   Wt 117 kg (258 lb)   SpO2  100%   BMI 38.10 kg/m   Physical Exam  Constitutional: He appears well-developed and well-nourished. No distress.  HENT:  Head: Normocephalic and atraumatic.  Eyes: Conjunctivae are normal.  Neck: Neck supple.  Cardiovascular: Normal rate, regular rhythm and normal heart sounds.  Pulmonary/Chest: Effort normal. No respiratory distress. He has no wheezes. He has no rales.  Abdominal: Soft. Bowel sounds are normal. He exhibits no distension. There is tenderness. There is no rebound.  Tenderness in the right  inguinal area, no palpable hernias.  Genitourinary:  Genitourinary Comments: Patient deferred scrotal exam, see primary care doctor's note  Musculoskeletal: He exhibits no edema.  Neurological: He is alert.  Skin: Skin is warm and dry.  Nursing note and vitals reviewed.    ED Treatments / Results  Labs (all labs ordered are listed, but only abnormal results are displayed) Labs Reviewed  COMPREHENSIVE METABOLIC PANEL - Abnormal; Notable for the following components:      Result Value   Creatinine, Ser 1.33 (*)    All other components within normal limits  URINALYSIS, ROUTINE W REFLEX MICROSCOPIC - Abnormal; Notable for the following components:   Leukocytes, UA TRACE (*)    Bacteria, UA RARE (*)    Squamous Epithelial / LPF 0-5 (*)    All other components within normal limits  URINE CULTURE  LIPASE, BLOOD  CBC    EKG  EKG Interpretation None       Radiology Ct Abdomen Pelvis W Contrast  Result Date: 12/04/2017 CLINICAL DATA:  47 year old male with a history of abdominal pain. EXAM: CT ABDOMEN AND PELVIS WITH CONTRAST TECHNIQUE: Multidetector CT imaging of the abdomen and pelvis was performed using the standard protocol following bolus administration of intravenous contrast. CONTRAST:  ISOVUE-300 IOPAMIDOL (ISOVUE-300) INJECTION 61% COMPARISON:  None. FINDINGS: Lower chest: No acute abnormality. Hepatobiliary: Unremarkable appearance of liver parenchyma. Unremarkable gallbladder. Pancreas: Unremarkable pancreas Spleen: Unremarkable spleen Adrenals/Urinary Tract: Unremarkable appearance of the adrenal glands. No evidence of hydronephrosis of the right or left kidney. No nephrolithiasis. Unremarkable course of the right ureter. There is a 1 mm-2 mm stone at the left ureterovesical junction. No significant inflammatory changes. No left-sided hydronephrosis. Stomach/Bowel: Small hiatal hernia. Unremarkable stomach. Unremarkable small bowel. No abnormal distention. No focal  inflammatory changes. No transition point. Normal appendix. Mild formed stool burden. No inflammatory changes. No transition point. Diverticular disease without evidence of acute diverticulitis. Vascular/Lymphatic: No significant atherosclerotic changes. Unremarkable appearance of the abdominal aorta, mesenteric arteries, renal arteries, bilateral iliac arteries. Proximal femoral arteries patent. Reproductive: Unremarkable appearance of the pelvic structures Other: Small fat containing umbilical hernia. Fat containing right inguinal hernia. Musculoskeletal: No acute fracture. Mild degenerative changes of the spine. Pseudoarticulation of the left L5 transverse process. IMPRESSION: There is a small nonobstructive distal left ureteral stone at the ureterovesical junction. No hydronephrosis or inflammatory changes. Small hiatal hernia. Diverticular disease without evidence of acute diverticulitis. Electronically Signed   By: Gilmer Mor D.O.   On: 12/04/2017 21:25    Procedures Procedures (including critical care time)  Medications Ordered in ED Medications - No data to display   Initial Impression / Assessment and Plan / ED Course  I have reviewed the triage vital signs and the nursing notes.  Pertinent labs & imaging results that were available during my care of the patient were reviewed by me and considered in my medical decision making (see chart for details).     Patient sent from primary care doctor's office for possible incarcerated right  inguinal hernia.  Patient deferred testicular exam by me.  Primary care doctor's note from 3:30 PM today describes his testicular exam as "right testicle is normal, distended, nontender.  Left testicle is atrophic."  Doubt testicular torsion with no tenderness to the scrotum, doubt infectious process, however will get urine and I do not feel any obvious hernias in right inguinal region on my exam, however patient does have tenderness.  He has positive bowel  sounds.  Will get labs and CT abdomen and pelvis.  9:58 PM CT scan back showing small fat-containing umbilical and fat-containing right inguinal hernia.  No other acute findings other than small nonobstructive left distal ureteral stone.  Patient denies any urinary symptoms or pain in his left side.  I have discussed his findings with him and he voiced understanding.  At this time there is no evidence of incarcerated or strangulated hernia.  His labs are all unremarkable.  He is nontoxic.  He is passing stool and gas.  He is not vomiting.  We will have him follow-up with general surgery as needed.  Advised to stop lifting heavy, take Tylenol or Motrin for pain, discussed strict return precautions.  Vitals:   12/04/17 1654 12/04/17 2125  BP: (!) 163/110 (!) 145/91  Pulse: 76 67  Resp: 18 16  Temp: 98.2 F (36.8 C)   TempSrc: Oral   SpO2: 100% 100%  Weight: 117 kg (258 lb)   Height: 5\' 9"  (1.753 m)      Final Clinical Impressions(s) / ED Diagnoses   Final diagnoses:  Right inguinal hernia  Umbilical hernia without obstruction and without gangrene  Hiatal hernia  Kidney stone on left side    ED Discharge Orders    None       Jaynie Crumble, PA-C 12/04/17 2315    Shaune Pollack, MD 12/05/17 0000

## 2017-12-04 NOTE — ED Triage Notes (Signed)
Patient reports he is experiencing right groin pain for the last 3 weeks with nausea this morning. Patient reports he lifts weight and lifts heavy things while working. Patient saw Dr. Beaulah DinningA. Plotnikov today for possible right incarcerated small inguinal hernia. Patient was suppose to have lab work and CT scan tomorrow but he suggested to come tonight.

## 2017-12-04 NOTE — Patient Instructions (Addendum)
Please go to ER now:  [address], [place], [place] [address]  Phone:[phone]

## 2017-12-06 ENCOUNTER — Telehealth: Payer: Self-pay | Admitting: Internal Medicine

## 2017-12-06 DIAGNOSIS — K4090 Unilateral inguinal hernia, without obstruction or gangrene, not specified as recurrent: Secondary | ICD-10-CM

## 2017-12-06 NOTE — Telephone Encounter (Signed)
Ok for referral  To general surgury -  done

## 2017-12-06 NOTE — Telephone Encounter (Signed)
Pt was seen for a hernia at the ED. Would he need an appt to discuss referral? Please advise.

## 2017-12-06 NOTE — Telephone Encounter (Signed)
Copied from CRM [id number]#[id number]. Topic: Quick Communication - See Telephone Encounter >> Dec 06, 2017  8:18 AM Jack Carney, [name] D wrote: CRM for notification. See Telephone encounter for: 12/06/17. Patient called and wants to talk to provider or his CMA about having a referral to see a surgent because is was seen at the ED last night. Please call patient back [phone] or leave a message on vm for patient its ok.

## 2017-12-07 LAB — URINE CULTURE

## 2017-12-08 ENCOUNTER — Telehealth: Payer: Self-pay

## 2017-12-08 NOTE — Telephone Encounter (Signed)
Post ED Visit - Positive Culture Follow-up: Successful Patient Follow-Up  Culture assessed and recommendations reviewed by: [x]  Enzo BiNathan Batchelder, Pharm.D. []  Celedonio MiyamotoJeremy Frens, Pharm.D., BCPS AQ-ID []  Garvin FilaMike Maccia, Pharm.D., BCPS []  Georgina PillionElizabeth Martin, Pharm.D., BCPS []  BaldwinsvilleMinh Pham, [address] BoulevardPharm.D., BCPS, AAHIVP []  Estella HuskMichelle Turner, Pharm.D., BCPS, AAHIVP []  Lysle Pearlachel Rumbarger, PharmD, BCPS []  Casilda Carlsaylor Stone, PharmD, BCPS []  Pollyann SamplesAndy Johnston, PharmD, BCPS  Positive urine culture  [x]  Patient discharged without antimicrobial prescription and treatment is now indicated []  Organism is resistant to prescribed ED discharge antimicrobial []  Patient with positive blood cultures  Changes discussed with ED provider: Cheron SchaumannLeslie Sofia PA-C New antibiotic prescription Keflex 500 mg PO BID x 7 days Called to [facility] [address]  [phone]  Contacted patient, date 12/08/17, time 0940   Jerry CarasCullom, Nareg Breighner Burnett 12/08/2017, 9:39 AM

## 2017-12-08 NOTE — Progress Notes (Signed)
ED Antimicrobial Stewardship Positive Culture Follow Up   Jack Carney is an 47 y.o. male who presented to [facility] on 12/04/2017 with a chief complaint of  Chief Complaint  Patient presents with  . Groin Pain    Recent Results (from the past 720 hour(s))  Urine culture     Status: Abnormal   Collection Time: 12/04/17  7:59 PM  Result Value Ref Range Status   Specimen Description   Final    URINE, RANDOM Performed at [facility], [address] [place], [place] [address]    Special Requests   Final    NONE Performed at [facility], [address] [place], [place] [address]    Culture >=100,000 COLONIES/mL CITROBACTER KOSERI (A)  Final   Report Status 12/07/2017 FINAL  Final   Organism ID, Bacteria CITROBACTER KOSERI (A)  Final      Susceptibility   Citrobacter koseri - MIC*    CEFAZOLIN <=4 SENSITIVE Sensitive     CEFTRIAXONE <=1 SENSITIVE Sensitive     CIPROFLOXACIN <=0.25 SENSITIVE Sensitive     GENTAMICIN <=1 SENSITIVE Sensitive     IMIPENEM <=0.25 SENSITIVE Sensitive     NITROFURANTOIN <=16 SENSITIVE Sensitive     TRIMETH/SULFA <=20 SENSITIVE Sensitive     PIP/TAZO <=4 SENSITIVE Sensitive     * >=100,000 COLONIES/mL CITROBACTER KOSERI    [x]  Patient discharged originally without antimicrobial agent and treatment is now indicated  Had urinary frequency reported at PCP office  New antibiotic prescription: Keflex 500mg  PO BID x 7 days  ED Provider: Cheron SchaumannSofia Leslie PA-C   Armandina StammerBATCHELDER,Halana Deisher J 12/08/2017, 9:22 AM Infectious Diseases Pharmacist Phone# [phone]

## 2017-12-15 ENCOUNTER — Telehealth: Payer: Self-pay | Admitting: Internal Medicine

## 2017-12-15 NOTE — Telephone Encounter (Signed)
Called and spoke with pt giving him the two options stated by Dr. Maple HudsonYoung for pt to try.  Pt stated he would call us letting us know which option he decided to choose.  Nothing further needed at this current time.

## 2017-12-15 NOTE — Telephone Encounter (Signed)
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  We usually treat mild apnea like this if symptoms are disturbing. In his case loud snoring would be a good reason to treat. We can either start CPAP ( new DME, new CPAP auto 5-20, mask of choice, humidifier, supplies, AirView and return ov in 31-90 days)  Or We can refer to Dr Althea GrimmerMark Katz, orthodontist,  for oral appliance (mouth piece) to wear at night to treat sleep apnea ,  In which case he would cancel f/u appointment with us and just return if needed.

## 2017-12-15 NOTE — Telephone Encounter (Signed)
Pt called our office wanting to know the results of HST.  Pt was last at office 11/03/17 and HST was done 11/20/17.  Dr. Maple HudsonYoung, please advise if you have results of this HST for pt.  Thanks!

## 2018-01-16 ENCOUNTER — Ambulatory Visit: Payer: Self-pay | Admitting: Internal Medicine

## 2018-01-16 DIAGNOSIS — Z0289 Encounter for other administrative examinations: Secondary | ICD-10-CM

## 2018-02-05 ENCOUNTER — Ambulatory Visit: Payer: Self-pay | Admitting: Internal Medicine

## 2018-12-08 ENCOUNTER — Emergency Department (HOSPITAL_COMMUNITY): Payer: BC Managed Care – PPO

## 2018-12-08 ENCOUNTER — Emergency Department (HOSPITAL_COMMUNITY)
Admission: EM | Admit: 2018-12-08 | Discharge: 2018-12-08 | Disposition: A | Discharge status: Home / Self Care | Payer: BC Managed Care – PPO | Attending: Emergency Medicine | Admitting: Emergency Medicine

## 2018-12-08 ENCOUNTER — Other Ambulatory Visit: Payer: Self-pay

## 2018-12-08 ENCOUNTER — Encounter (HOSPITAL_COMMUNITY): Payer: Self-pay

## 2018-12-08 DIAGNOSIS — R10A2 Flank pain, left side: Secondary | ICD-10-CM

## 2018-12-08 DIAGNOSIS — R3129 Other microscopic hematuria: Secondary | ICD-10-CM | POA: Diagnosis not present

## 2018-12-08 DIAGNOSIS — J45909 Unspecified asthma, uncomplicated: Secondary | ICD-10-CM | POA: Diagnosis not present

## 2018-12-08 DIAGNOSIS — R1032 Left lower quadrant pain: Secondary | ICD-10-CM | POA: Insufficient documentation

## 2018-12-08 DIAGNOSIS — I10 Essential (primary) hypertension: Secondary | ICD-10-CM

## 2018-12-08 DIAGNOSIS — R030 Elevated blood-pressure reading, without diagnosis of hypertension: Secondary | ICD-10-CM | POA: Insufficient documentation

## 2018-12-08 DIAGNOSIS — Z79899 Other long term (current) drug therapy: Secondary | ICD-10-CM | POA: Insufficient documentation

## 2018-12-08 DIAGNOSIS — R109 Unspecified abdominal pain: Secondary | ICD-10-CM

## 2018-12-08 LAB — URINALYSIS, ROUTINE W REFLEX MICROSCOPIC
Bacteria, UA: NONE SEEN
Bilirubin Urine: NEGATIVE
Glucose, UA: NEGATIVE mg/dL
KETONES UR: NEGATIVE mg/dL
LEUKOCYTE UA: NEGATIVE
Nitrite: NEGATIVE
PROTEIN: NEGATIVE mg/dL
Specific Gravity, Urine: 1.01 (ref 1.005–1.030)
pH: 6 (ref 5.0–8.0)

## 2018-12-08 LAB — CBC WITH DIFFERENTIAL/PLATELET
Abs Immature Granulocytes: 0.05 10*3/uL (ref 0.00–0.07)
BASOS PCT: 0 %
Basophils Absolute: 0 10*3/uL (ref 0.0–0.1)
EOS ABS: 0 10*3/uL (ref 0.0–0.5)
Eosinophils Relative: 0 %
HCT: 47.7 % (ref 39.0–52.0)
Hemoglobin: 14.8 g/dL (ref 13.0–17.0)
Immature Granulocytes: 1 %
Lymphocytes Relative: 12 %
Lymphs Abs: 1.1 10*3/uL (ref 0.7–4.0)
MCH: 25.8 pg — AB (ref 26.0–34.0)
MCHC: 31 g/dL (ref 30.0–36.0)
MCV: 83.2 fL (ref 80.0–100.0)
MONO ABS: 0.7 10*3/uL (ref 0.1–1.0)
Monocytes Relative: 9 %
NEUTROS PCT: 78 %
Neutro Abs: 6.6 10*3/uL (ref 1.7–7.7)
PLATELETS: 231 10*3/uL (ref 150–400)
RBC: 5.73 MIL/uL (ref 4.22–5.81)
RDW: 14.7 % (ref 11.5–15.5)
WBC: 8.5 10*3/uL (ref 4.0–10.5)
nRBC: 0 % (ref 0.0–0.2)

## 2018-12-08 LAB — COMPREHENSIVE METABOLIC PANEL
ALT: 42 U/L (ref 0–44)
AST: 29 U/L (ref 15–41)
Albumin: 4.7 g/dL (ref 3.5–5.0)
Alkaline Phosphatase: 60 U/L (ref 38–126)
Anion gap: 9 (ref 5–15)
BUN: 15 mg/dL (ref 6–20)
CHLORIDE: 107 mmol/L (ref 98–111)
CO2: 22 mmol/L (ref 22–32)
Calcium: 9.3 mg/dL (ref 8.9–10.3)
Creatinine, Ser: 1.18 mg/dL (ref 0.61–1.24)
Glucose, Bld: 131 mg/dL — ABNORMAL HIGH (ref 70–99)
POTASSIUM: 4 mmol/L (ref 3.5–5.1)
Sodium: 138 mmol/L (ref 135–145)
Total Bilirubin: 0.9 mg/dL (ref 0.3–1.2)
Total Protein: 8.4 g/dL — ABNORMAL HIGH (ref 6.5–8.1)

## 2018-12-08 NOTE — ED Provider Notes (Addendum)
[facility]-EMERGENCY DEPT Provider Note   CSN: [id number] Arrival date & time: 12/08/18  0234     History   Chief Complaint Chief Complaint  Patient presents with  . Flank Pain    HPI Jack Carney is a 48 y.o. male.  HPI  Patient is a 48 year old male with a history of hyperlipidemia, allergic rhinitis, morbid obesity presenting for left flank pain radiating into the groin.  Patient reports that it was sudden onset approximately 12 hours ago.  He reports it is intermittent in nature and crampy.  Denies any history of nephrolithiasis, does report that he had imaging approximately a year ago which showed that he had a nonobstructing stone as well as a small hiatal hernia.  Patient denies any fevers, chills, nausea or vomiting.  Last bowel movement yesterday and normal for patient.  He is passing flatus.  Patient denies any testicular pain or swelling.  He does report some suprapubic pressure to urinate.  He does report that he had some sharp rectal pain earlier today but this is resolved.  No pain with bowel movements.  He reports that his pain is mostly gone currently.  Reports sexual activity with 1 male partner.  Past Medical History:  Diagnosis Date  . ALLERGIC RHINITIS 05/08/2007   Qualifier: Diagnosis of  By: Jonny Ruiz MD, Len Blalock   . ASTHMA 05/08/2007   Qualifier: Diagnosis of  By: Jonny Ruiz MD, Len Blalock   . HYPERLIPIDEMIA 05/08/2007   Qualifier: Diagnosis of  By: Jonny Ruiz MD, Len Blalock     Patient Active Problem List   Diagnosis Date Noted  . Inguinal pain, right 12/04/2017  . Acute sinus infection 03/07/2013  . Routine general medical examination at a health care facility 03/07/2013  . HYPERSOMNIA 10/16/2007  . Hyperlipidemia 05/08/2007  . Morbid obesity (HCC) 05/08/2007  . Seasonal and perennial allergic rhinitis 05/08/2007  . ASTHMA 05/08/2007    Past Surgical History:  Procedure Laterality Date  . sinus surgury    . TONSILLECTOMY          Home  Medications    Prior to Admission medications   Medication Sig Start Date End Date Taking? Authorizing Provider  amLODipine (NORVASC) 2.5 MG tablet Take 1 tablet (2.5 mg total) by mouth daily. 10/18/17  Yes Corwin Levins, MD  pseudoephedrine (SUDAFED) 60 MG tablet Take 60 mg by mouth every 4 (four) hours as needed for congestion.   Yes [provider]    Family History Family History  Problem Relation Age of Onset  . Hypertension Unknown     Social History Social History   Tobacco Use  . Smoking status: Never Smoker  . Smokeless tobacco: Never Used  Substance Use Topics  . Alcohol use: No  . Drug use: No     Allergies   Sulfonamide derivatives   Review of Systems Review of Systems  Constitutional: Negative for chills and fever.  HENT: Negative for congestion and sore throat.   Eyes: Negative for visual disturbance.  Respiratory: Negative for cough, chest tightness and shortness of breath.   Cardiovascular: Negative for chest pain.  Gastrointestinal: Negative for abdominal pain, nausea and vomiting.  Genitourinary: Positive for flank pain. Negative for dysuria, penile pain, penile swelling and testicular pain.  Musculoskeletal: Negative for back pain and myalgias.  Skin: Negative for rash.  Neurological: Negative for dizziness and syncope.     Physical Exam Updated Vital Signs BP (!) 149/105 (BP Location: Left Arm)   Pulse 72  Temp (!) 97.4 F (36.3 C) (Oral)   Resp 16   SpO2 100%   Physical Exam Vitals signs and nursing note reviewed.  Constitutional:      General: He is not in acute distress.    Appearance: He is well-developed. He is obese.  HENT:     Head: Normocephalic and atraumatic.  Eyes:     Conjunctiva/sclera: Conjunctivae normal.     Pupils: Pupils are equal, round, and reactive to light.  Neck:     Musculoskeletal: Normal range of motion and neck supple.  Cardiovascular:     Rate and Rhythm: Normal rate and regular rhythm.      Heart sounds: S1 normal and S2 normal. No murmur.  Pulmonary:     Effort: Pulmonary effort is normal.     Breath sounds: Normal breath sounds. No wheezing or rales.  Abdominal:     General: There is no distension.     Palpations: Abdomen is soft.     Tenderness: There is no abdominal tenderness. There is no guarding.     Comments: No CVA tenderness.  Genitourinary:    Comments: Declines GU examination.  States that he does not have any testicular pain or swelling does not need this examined.  Also denies rectal examination. Musculoskeletal: Normal range of motion.        General: No deformity.  Lymphadenopathy:     Cervical: No cervical adenopathy.  Skin:    General: Skin is warm and dry.     Findings: No erythema or rash.  Neurological:     Mental Status: He is alert.     Comments: Cranial nerves grossly intact. Patient moves extremities symmetrically and with good coordination.  Psychiatric:        Behavior: Behavior normal.        Thought Content: Thought content normal.        Judgment: Judgment normal.      ED Treatments / Results  Labs (all labs ordered are listed, but only abnormal results are displayed) Labs Reviewed  URINALYSIS, ROUTINE W REFLEX MICROSCOPIC - Abnormal; Notable for the following components:      Result Value   Color, Urine STRAW (*)    Hgb urine dipstick SMALL (*)    All other components within normal limits  CBC WITH DIFFERENTIAL/PLATELET - Abnormal; Notable for the following components:   MCH 25.8 (*)    All other components within normal limits  COMPREHENSIVE METABOLIC PANEL - Abnormal; Notable for the following components:   Glucose, Bld 131 (*)    Total Protein 8.4 (*)    All other components within normal limits    EKG None  Radiology Ct Renal Stone Study  Result Date: 12/08/2018 CLINICAL DATA:  Left flank/abdominal pain EXAM: CT ABDOMEN AND PELVIS WITHOUT CONTRAST TECHNIQUE: Multidetector CT imaging of the abdomen and pelvis was  performed following the standard protocol without oral or IV contrast. COMPARISON:  December 04, 2017 FINDINGS: Lower chest: There is mild bibasilar atelectatic change. No lung base edema or consolidation evident. Hepatobiliary: No liver lesions are appreciable on this noncontrast enhanced study. Gallbladder wall is not appreciably thickened. There is no biliary duct dilatation. Pancreas: No pancreatic mass or inflammatory focus. Spleen: No splenic lesions are evident. Adrenals/Urinary Tract: Adrenals bilaterally appear unremarkable. Kidneys bilaterally show no evident mass or hydronephrosis on either side. There is a 2 mm calculus in the mid to lower right kidney. There is no evident ureteral calculus on either side. The urinary bladder is  midline with wall thickness within normal limits. Stomach/Bowel: There is no appreciable bowel wall or mesenteric thickening. There is no evident bowel obstruction. There is no free air or portal venous air. Vascular/Lymphatic: No abdominal aortic aneurysm. No evident vascular lesions on this noncontrast enhanced study. There is no evident adenopathy in the abdomen or pelvis. Reproductive: Prostate and seminal vesicles appear normal in size and contour. There is no evident pelvic mass. Other: The appendix appears unremarkable. There is no abscess or ascites in the abdomen or pelvis. There is a rather minimal ventral hernia containing only fat. Musculoskeletal: There are no blastic or lytic bone lesions. There is degenerative change at a left-sided L5-S1 assimilation joint. No intramuscular lesions are evident. IMPRESSION: 1. Nonobstructing 2 mm calculus mid to lower pole region right kidney. No hydronephrosis or ureteral calculus on either side. Urinary bladder wall thickness within normal limits. 2. No bowel obstruction. No abscess in the abdomen or pelvis. Appendix appears normal. 3.  Rather minimal ventral hernia containing only fat Electronically Signed   By: Bretta BangWilliam   Woodruff III M.D.   On: 12/08/2018 05:21    Procedures Procedures (including critical care time)  Medications Ordered in ED Medications - No data to display   Initial Impression / Assessment and Plan / ED Course  I have reviewed the triage vital signs and the nursing notes.  Pertinent labs & imaging results that were available during my care of the patient were reviewed by me and considered in my medical decision making (see chart for details).     Patient is nontoxic-appearing, afebrile, with benign abdomen.  He is without pain at present.  Differential diagnosis includes nephrolithiasis, epididymitis, prostatitis, musculoskeletal pain.  Patient denies any pain at present.  Patient declined GU and rectal examination stating that he has normal exams in the GU region.    Scans reviewed from 1 year ago.  Doubt AAA as patient had normal aorta 1 year ago, is nontoxic and without pain currently.  Also renal stone study today demonstrates no evidence of vascular anomaly.  Work-up showing small amount of hemoglobinuria.  No bacteria or WBCs.  Normal renal function.  CT renal stone study demonstrate nonobstructing right renal stone.  Patient has small ventral hernia containing only fat.  Discussed with the patient that it is possible that he passed a small stone that is not radiographically present.  On CT, specifically no prostatic enlargement.  He is without pain currently.   He is not experiencing any dysuria, urgency, frequency, suprapubic pain or pressure currently.  No rectal pain currently.  His urine shows no evidence of infection.  Feel that he is overall low risk for prostatitis, however I did counsel the patient that he should return immediately should he develop any further symptoms of suprapubic pain, pressure, or rectal pain.  Patient has a primary care provider and I did encourage him to follow-up with his primary care provider within 1 week to recheck urinalysis as well as discuss  testing for prostatic enlargement.  Patient denies any history of prostate cancer.  Patient was given return precautions for any fevers, chills, worsening pain, suprapubic pain, testicular pain, rectal pain or intractable nausea or vomiting.  Patient is in understanding and agrees with plan of care.  Final Clinical Impressions(s) / ED Diagnoses   Final diagnoses:  Left flank pain  Elevated blood pressure reading in office with diagnosis of hypertension  Microscopic hematuria    ED Discharge Orders    None  Elisha PonderMurray, Karisa Nesser B, PA-C 12/08/18 0727    Aviva KluverMurray, Delvin Hedeen B, PA-C 12/08/18 [id number]    Derwood KaplanNanavati, Ankit, MD 12/08/18 [phone]

## 2018-12-08 NOTE — ED Triage Notes (Signed)
Pt reports sudden onset of L flank pain at midnight. He states that last year, he was told that they saw a kidney stone on a scan, but he never had any symptoms. Tonight, he reports the L flank pain radiating to groin and rectum. A&Ox4. Denies hematuria. Endorsing nausea, no vomiting or fever.

## 2018-12-08 NOTE — Discharge Instructions (Signed)
Please see the information and instructions below regarding your visit.  Your diagnoses today include:  1. Left flank pain   2. Elevated blood pressure reading in office with diagnosis of hypertension     Tests performed today include: See side panel of your discharge paperwork for testing performed today. Vital signs are listed at the bottom of these instructions.   There is no evidence of a stone that is passing in the ureter on your scan.  You do have some nonobstructing right stones.  These means that they are just sitting in the kidney.  Lab work is normal.  Urine shows a small amount of blood, but no evidence of infection.  Medications prescribed:    Take any prescribed medications only as prescribed, and any over the counter medications only as directed on the packaging.  Home care instructions:  Please follow any educational materials contained in this packet.   Follow-up instructions: Please follow-up with your primary care provider in one week for further evaluation of your symptoms if they are not completely improved.   Return instructions:  Please return to the Emergency Department if you experience worsening symptoms.  Please return to the emergency department if you develop any worsening pain, difficulty urinating, rectal pain, abdominal pain, nausea vomiting the prevention keep anything down, or testicular pain. Please return if you have any other emergent concerns.  Additional Information:   Your vital signs today were: BP (!) 149/105 (BP Location: Left Arm)    Pulse 72    Temp (!) 97.4 F (36.3 C) (Oral)    Resp 16    SpO2 100%  If your blood pressure (BP) was elevated on multiple readings during this visit above 130 for the top number or above 80 for the bottom number, please have this repeated by your primary care provider within one month. --------------  Thank you for allowing [place] to participate in your care today.

## 2018-12-24 ENCOUNTER — Other Ambulatory Visit: Payer: Self-pay

## 2018-12-24 ENCOUNTER — Ambulatory Visit: Payer: Self-pay | Admitting: Internal Medicine

## 2018-12-24 ENCOUNTER — Inpatient Hospital Stay (HOSPITAL_BASED_OUTPATIENT_CLINIC_OR_DEPARTMENT_OTHER)
Admission: EM | Admit: 2018-12-24 | Discharge: 2018-12-26 | DRG: 872 | Disposition: A | Discharge status: Home / Self Care | Payer: BC Managed Care – PPO | Attending: Internal Medicine | Admitting: Internal Medicine

## 2018-12-24 ENCOUNTER — Encounter (HOSPITAL_BASED_OUTPATIENT_CLINIC_OR_DEPARTMENT_OTHER): Payer: Self-pay | Admitting: Emergency Medicine

## 2018-12-24 ENCOUNTER — Ambulatory Visit: Payer: Self-pay | Admitting: *Deleted

## 2018-12-24 DIAGNOSIS — A419 Sepsis, unspecified organism: Principal | ICD-10-CM | POA: Diagnosis present

## 2018-12-24 DIAGNOSIS — E669 Obesity, unspecified: Secondary | ICD-10-CM | POA: Diagnosis present

## 2018-12-24 DIAGNOSIS — E785 Hyperlipidemia, unspecified: Secondary | ICD-10-CM | POA: Diagnosis not present

## 2018-12-24 DIAGNOSIS — Z880 Allergy status to penicillin: Secondary | ICD-10-CM | POA: Diagnosis not present

## 2018-12-24 DIAGNOSIS — A498 Other bacterial infections of unspecified site: Secondary | ICD-10-CM | POA: Diagnosis not present

## 2018-12-24 DIAGNOSIS — I10 Essential (primary) hypertension: Secondary | ICD-10-CM | POA: Diagnosis present

## 2018-12-24 DIAGNOSIS — Z87442 Personal history of urinary calculi: Secondary | ICD-10-CM | POA: Diagnosis not present

## 2018-12-24 DIAGNOSIS — R008 Other abnormalities of heart beat: Secondary | ICD-10-CM

## 2018-12-24 DIAGNOSIS — J45909 Unspecified asthma, uncomplicated: Secondary | ICD-10-CM | POA: Diagnosis present

## 2018-12-24 DIAGNOSIS — J4520 Mild intermittent asthma, uncomplicated: Secondary | ICD-10-CM

## 2018-12-24 DIAGNOSIS — N12 Tubulo-interstitial nephritis, not specified as acute or chronic: Secondary | ICD-10-CM | POA: Diagnosis not present

## 2018-12-24 DIAGNOSIS — Z79899 Other long term (current) drug therapy: Secondary | ICD-10-CM | POA: Diagnosis not present

## 2018-12-24 DIAGNOSIS — Z8249 Family history of ischemic heart disease and other diseases of the circulatory system: Secondary | ICD-10-CM | POA: Diagnosis not present

## 2018-12-24 DIAGNOSIS — N200 Calculus of kidney: Secondary | ICD-10-CM | POA: Diagnosis present

## 2018-12-24 DIAGNOSIS — N179 Acute kidney failure, unspecified: Secondary | ICD-10-CM | POA: Diagnosis present

## 2018-12-24 DIAGNOSIS — Z6837 Body mass index (BMI) 37.0-37.9, adult: Secondary | ICD-10-CM | POA: Diagnosis not present

## 2018-12-24 DIAGNOSIS — E860 Dehydration: Secondary | ICD-10-CM | POA: Diagnosis present

## 2018-12-24 DIAGNOSIS — Z9114 Patient's other noncompliance with medication regimen: Secondary | ICD-10-CM | POA: Diagnosis not present

## 2018-12-24 DIAGNOSIS — N10 Acute tubulo-interstitial nephritis: Secondary | ICD-10-CM | POA: Diagnosis present

## 2018-12-24 DIAGNOSIS — Z91128 Patient's intentional underdosing of medication regimen for other reason: Secondary | ICD-10-CM | POA: Diagnosis not present

## 2018-12-24 DIAGNOSIS — T50906A Underdosing of unspecified drugs, medicaments and biological substances, initial encounter: Secondary | ICD-10-CM | POA: Diagnosis present

## 2018-12-24 DIAGNOSIS — Y92009 Unspecified place in unspecified non-institutional (private) residence as the place of occurrence of the external cause: Secondary | ICD-10-CM

## 2018-12-24 DIAGNOSIS — Z882 Allergy status to sulfonamides status: Secondary | ICD-10-CM

## 2018-12-24 LAB — COMPREHENSIVE METABOLIC PANEL
ALT: 23 U/L (ref 0–44)
AST: 21 U/L (ref 15–41)
Albumin: 4.3 g/dL (ref 3.5–5.0)
Alkaline Phosphatase: 62 U/L (ref 38–126)
Anion gap: 9 (ref 5–15)
BUN: 21 mg/dL — ABNORMAL HIGH (ref 6–20)
CO2: 22 mmol/L (ref 22–32)
Calcium: 8.7 mg/dL — ABNORMAL LOW (ref 8.9–10.3)
Chloride: 103 mmol/L (ref 98–111)
Creatinine, Ser: 1.99 mg/dL — ABNORMAL HIGH (ref 0.61–1.24)
GFR calc Af Amer: 44 mL/min — ABNORMAL LOW (ref >60)
GFR calc non Af Amer: 38 mL/min — ABNORMAL LOW (ref >60)
Glucose, Bld: 119 mg/dL — ABNORMAL HIGH (ref 70–99)
Potassium: 3.7 mmol/L (ref 3.5–5.1)
Sodium: 134 mmol/L — ABNORMAL LOW (ref 135–145)
Total Bilirubin: 1 mg/dL (ref 0.3–1.2)
Total Protein: 7.8 g/dL (ref 6.5–8.1)

## 2018-12-24 LAB — CBC WITH DIFFERENTIAL/PLATELET
Abs Immature Granulocytes: 0.07 10*3/uL (ref 0.00–0.07)
Basophils Absolute: 0 10*3/uL (ref 0.0–0.1)
Basophils Relative: 0 %
Eosinophils Absolute: 0.1 10*3/uL (ref 0.0–0.5)
Eosinophils Relative: 1 %
HCT: 44.4 % (ref 39.0–52.0)
Hemoglobin: 14 g/dL (ref 13.0–17.0)
Immature Granulocytes: 1 %
Lymphocytes Relative: 8 %
Lymphs Abs: 0.9 10*3/uL (ref 0.7–4.0)
MCH: 25.5 pg — ABNORMAL LOW (ref 26.0–34.0)
MCHC: 31.5 g/dL (ref 30.0–36.0)
MCV: 80.7 fL (ref 80.0–100.0)
Monocytes Absolute: 1.6 10*3/uL — ABNORMAL HIGH (ref 0.1–1.0)
Monocytes Relative: 13 %
Neutro Abs: 9.3 10*3/uL — ABNORMAL HIGH (ref 1.7–7.7)
Neutrophils Relative %: 77 %
Platelets: 181 10*3/uL (ref 150–400)
RBC: 5.5 MIL/uL (ref 4.22–5.81)
RDW: 14.6 % (ref 11.5–15.5)
WBC: 12 10*3/uL — ABNORMAL HIGH (ref 4.0–10.5)
nRBC: 0 % (ref 0.0–0.2)

## 2018-12-24 LAB — URINALYSIS, ROUTINE W REFLEX MICROSCOPIC
Bilirubin Urine: NEGATIVE
GLUCOSE, UA: NEGATIVE mg/dL
Ketones, ur: 15 mg/dL — AB
Nitrite: NEGATIVE
PROTEIN: 30 mg/dL — AB
Specific Gravity, Urine: 1.02 (ref 1.005–1.030)
pH: 6 (ref 5.0–8.0)

## 2018-12-24 LAB — PROTIME-INR
INR: 1 (ref 0.8–1.2)
Prothrombin Time: 13.3 seconds (ref 11.4–15.2)

## 2018-12-24 LAB — LIPASE, BLOOD: Lipase: 30 U/L (ref 11–51)

## 2018-12-24 LAB — LACTIC ACID, PLASMA
LACTIC ACID, VENOUS: 1.2 mmol/L (ref 0.5–1.9)
Lactic Acid, Venous: 0.8 mmol/L (ref 0.5–1.9)
Lactic Acid, Venous: 1.3 mmol/L (ref 0.5–1.9)

## 2018-12-24 LAB — URINALYSIS, MICROSCOPIC (REFLEX): WBC, UA: >50 WBC/hpf (ref 0–5)

## 2018-12-24 LAB — APTT: aPTT: 31 seconds (ref 24–36)

## 2018-12-24 LAB — PROCALCITONIN: Procalcitonin: 0.93 ng/mL

## 2018-12-24 MED ORDER — ONDANSETRON HCL 4 MG PO TABS: 4.0000 mg | ORAL_TABLET | Freq: Four times a day (QID) | ORAL | Status: DC | PRN

## 2018-12-24 MED ORDER — GUAIFENESIN ER 600 MG PO TB12: 1200.0000 mg | ORAL_TABLET | Freq: Two times a day (BID) | ORAL | Status: DC | PRN

## 2018-12-24 MED ORDER — CEFTRIAXONE SODIUM 1 G IJ SOLR
1.0000 g | Freq: Once | INTRAMUSCULAR | Status: AC
  Administered 2018-12-24: 1 g via INTRAVENOUS
  Filled 2018-12-24: qty 10

## 2018-12-24 MED ORDER — ACETAMINOPHEN 325 MG PO TABS
650.0000 mg | ORAL_TABLET | Freq: Four times a day (QID) | ORAL | Status: DC | PRN
  Administered 2018-12-25 (×2): 650 mg via ORAL
  Filled 2018-12-24 (×2): qty 2

## 2018-12-24 MED ORDER — SODIUM CHLORIDE 0.9 % IV BOLUS
1000.0000 mL | Freq: Once | INTRAVENOUS | Status: AC
  Administered 2018-12-24: 1000 mL via INTRAVENOUS

## 2018-12-24 MED ORDER — SODIUM CHLORIDE 0.9 % IV SOLN: Freq: Once | INTRAVENOUS | Status: DC

## 2018-12-24 MED ORDER — ONDANSETRON HCL 4 MG/2ML IJ SOLN
4.0000 mg | Freq: Once | INTRAMUSCULAR | Status: AC
  Administered 2018-12-24: 4 mg via INTRAVENOUS
  Filled 2018-12-24: qty 2

## 2018-12-24 MED ORDER — MORPHINE SULFATE (PF) 2 MG/ML IV SOLN: 2.0000 mg | INTRAVENOUS | Status: DC | PRN

## 2018-12-24 MED ORDER — SODIUM CHLORIDE 0.9 % IV SOLN
2.0000 g | INTRAVENOUS | Status: DC
  Administered 2018-12-25: 2 g via INTRAVENOUS
  Filled 2018-12-24: qty 2
  Filled 2018-12-24: qty 20

## 2018-12-24 MED ORDER — HYDRALAZINE HCL 20 MG/ML IJ SOLN: 5.0000 mg | INTRAMUSCULAR | Status: DC | PRN

## 2018-12-24 MED ORDER — LEVALBUTEROL HCL 1.25 MG/0.5ML IN NEBU
1.2500 mg | INHALATION_SOLUTION | Freq: Four times a day (QID) | RESPIRATORY_TRACT | Status: DC
  Administered 2018-12-24: 1.25 mg via RESPIRATORY_TRACT
  Filled 2018-12-24: qty 0.5

## 2018-12-24 MED ORDER — MORPHINE SULFATE (PF) 4 MG/ML IV SOLN
4.0000 mg | Freq: Once | INTRAVENOUS | Status: AC
  Administered 2018-12-24: 4 mg via INTRAVENOUS
  Filled 2018-12-24: qty 1

## 2018-12-24 MED ORDER — SODIUM CHLORIDE 0.9 % IV BOLUS
500.0000 mL | Freq: Once | INTRAVENOUS | Status: AC
  Administered 2018-12-24: 500 mL via INTRAVENOUS

## 2018-12-24 MED ORDER — LEVALBUTEROL HCL 1.25 MG/0.5ML IN NEBU
1.2500 mg | INHALATION_SOLUTION | Freq: Two times a day (BID) | RESPIRATORY_TRACT | Status: DC
  Administered 2018-12-25: 1.25 mg via RESPIRATORY_TRACT
  Filled 2018-12-24: qty 0.5

## 2018-12-24 MED ORDER — ACETAMINOPHEN 500 MG PO TABS
1000.0000 mg | ORAL_TABLET | Freq: Once | ORAL | Status: AC
  Administered 2018-12-24: 1000 mg via ORAL
  Filled 2018-12-24: qty 2

## 2018-12-24 MED ORDER — PENTAFLUOROPROP-TETRAFLUOROETH EX AERO
INHALATION_SPRAY | CUTANEOUS | Status: AC
  Filled 2018-12-24: qty 30

## 2018-12-24 MED ORDER — ONDANSETRON HCL 4 MG/2ML IJ SOLN: 4.0000 mg | Freq: Four times a day (QID) | INTRAMUSCULAR | Status: DC | PRN

## 2018-12-24 MED ORDER — SODIUM CHLORIDE 0.9 % IV SOLN
INTRAVENOUS | Status: DC
  Administered 2018-12-24 – 2018-12-26 (×5): via INTRAVENOUS

## 2018-12-24 MED ORDER — ACETAMINOPHEN 650 MG RE SUPP: 650.0000 mg | Freq: Four times a day (QID) | RECTAL | Status: DC | PRN

## 2018-12-24 NOTE — ED Notes (Signed)
Pt aware of need for urine sample, states he is unable to void at this time. Will notify staff when able to provide cc sample.

## 2018-12-24 NOTE — ED Provider Notes (Addendum)
[facility] EMERGENCY DEPARTMENT Provider Note   CSN: [id number] Arrival date & time: 12/24/18  0907    History   Chief Complaint Chief Complaint  Patient presents with  . Flank Pain    HPI Jack Carney is a 48 y.o. male with history of hyperlipidemia, asthma who presents with left flank pain that began yesterday.  He reports he has also not had a bowel movement in 4 days.  He also reports his urine is darker.  He reports he feels dehydrated.  His left flank pain radiates around his suprapubic area as well as his testicles bilaterally.  He denies any scrotal swelling or penile discharge.  He reports he feels like he has had a fever.  No medications taken prior to arrival     HPI  Past Medical History:  Diagnosis Date  . ALLERGIC RHINITIS 05/08/2007   Qualifier: Diagnosis of  By: Jonny Ruiz MD, Len Blalock   . ASTHMA 05/08/2007   Qualifier: Diagnosis of  By: Jonny Ruiz MD, Len Blalock   . HYPERLIPIDEMIA 05/08/2007   Qualifier: Diagnosis of  By: Jonny Ruiz MD, Len Blalock     Patient Active Problem List   Diagnosis Date Noted  . Acute pyelonephritis 12/24/2018  . Inguinal pain, right 12/04/2017  . Acute sinus infection 03/07/2013  . Routine general medical examination at a health care facility 03/07/2013  . HYPERSOMNIA 10/16/2007  . Hyperlipidemia 05/08/2007  . Morbid obesity (HCC) 05/08/2007  . Seasonal and perennial allergic rhinitis 05/08/2007  . ASTHMA 05/08/2007    Past Surgical History:  Procedure Laterality Date  . sinus surgury    . TONSILLECTOMY          Home Medications    Prior to Admission medications   Medication Sig Start Date End Date Taking? Authorizing Provider  amLODipine (NORVASC) 2.5 MG tablet Take 1 tablet (2.5 mg total) by mouth daily. 10/18/17   Corwin Levins, MD  pseudoephedrine (SUDAFED) 60 MG tablet Take 60 mg by mouth every 4 (four) hours as needed for congestion.    [provider]    Family History Family History  Problem Relation Age of  Onset  . Hypertension Unknown     Social History Social History   Tobacco Use  . Smoking status: Never Smoker  . Smokeless tobacco: Never Used  Substance Use Topics  . Alcohol use: No  . Drug use: No     Allergies   Sulfonamide derivatives   Review of Systems Review of Systems  Constitutional: Positive for appetite change, chills and fever.  HENT: Negative for facial swelling and sore throat.   Respiratory: Negative for shortness of breath.   Cardiovascular: Negative for chest pain.  Gastrointestinal: Positive for abdominal pain, nausea and vomiting (once).  Genitourinary: Positive for flank pain and testicular pain. Negative for discharge, dysuria, penile pain, penile swelling and scrotal swelling.  Musculoskeletal: Negative for back pain.  Skin: Negative for rash and wound.  Neurological: Negative for headaches.  Psychiatric/Behavioral: The patient is not nervous/anxious.      Physical Exam Updated Vital Signs BP (!) 154/100 (BP Location: Right Arm)   Pulse (!) 113   Temp (!) 100.4 F (38 C) (Oral)   Resp 18   Ht  (1.753 m)   Wt 115.7 kg   SpO2 96%   BMI 37.66 kg/m   Physical Exam Vitals signs and nursing note reviewed.  Constitutional:      General: He is not in acute distress.  Appearance: He is well-developed. He is ill-appearing. He is not diaphoretic.  HENT:     Head: Normocephalic and atraumatic.     Mouth/Throat:     Pharynx: No oropharyngeal exudate.  Eyes:     General: No scleral icterus.       Right eye: No discharge.        Left eye: No discharge.     Conjunctiva/sclera: Conjunctivae normal.     Pupils: Pupils are equal, round, and reactive to light.  Neck:     Musculoskeletal: Normal range of motion and neck supple.     Thyroid: No thyromegaly.  Cardiovascular:     Rate and Rhythm: Regular rhythm. Tachycardia present.     Heart sounds: Normal heart sounds. No murmur. No friction rub. No gallop.   Pulmonary:     Effort:  Pulmonary effort is normal. No respiratory distress.     Breath sounds: Normal breath sounds. No stridor. No wheezing or rales.  Abdominal:     General: Bowel sounds are normal. There is no distension.     Palpations: Abdomen is soft.     Tenderness: There is abdominal tenderness in the suprapubic area. There is left CVA tenderness. There is no guarding or rebound.     Hernia: A hernia is present. Hernia is present in the right inguinal area (nontender, baseline) and left inguinal area (nontender, baseline).  Genitourinary:    Penis: Normal.      Scrotum/Testes:        Right: Tenderness present. Swelling not present.        Left: Tenderness present. Swelling not present.  Lymphadenopathy:     Cervical: No cervical adenopathy.  Skin:    General: Skin is warm and dry.     Coloration: Skin is not pale.     Findings: No rash.  Neurological:     Mental Status: He is alert.     Coordination: Coordination normal.      ED Treatments / Results  Labs (all labs ordered are listed, but only abnormal results are displayed) Labs Reviewed  URINALYSIS, ROUTINE W REFLEX MICROSCOPIC - Abnormal; Notable for the following components:      Result Value   APPearance CLOUDY (*)    Hgb urine dipstick LARGE (*)    Ketones, ur 15 (*)    Protein, ur 30 (*)    Leukocytes,Ua LARGE (*)    All other components within normal limits  COMPREHENSIVE METABOLIC PANEL - Abnormal; Notable for the following components:   Sodium 134 (*)    Glucose, Bld 119 (*)    BUN 21 (*)    Creatinine, Ser 1.99 (*)    Calcium 8.7 (*)    GFR calc non Af Amer 38 (*)    GFR calc Af Amer 44 (*)    All other components within normal limits  CBC WITH DIFFERENTIAL/PLATELET - Abnormal; Notable for the following components:   WBC 12.0 (*)    MCH 25.5 (*)    Neutro Abs 9.3 (*)    Monocytes Absolute 1.6 (*)    All other components within normal limits  URINALYSIS, MICROSCOPIC (REFLEX) - Abnormal; Notable for the following  components:   Bacteria, UA MANY (*)    All other components within normal limits  URINE CULTURE  CULTURE, BLOOD (ROUTINE X 2)  CULTURE, BLOOD (ROUTINE X 2)  LIPASE, BLOOD  LACTIC ACID, PLASMA  LACTIC ACID, PLASMA  BASIC METABOLIC PANEL    EKG None  Radiology No results found.  Procedures Procedures (including critical care time)  Medications Ordered in ED Medications  0.9 %  sodium chloride infusion ( Intravenous Transfusing/Transfer 12/24/18 1726)  sodium chloride 0.9 % bolus 1,000 mL (0 mLs Intravenous Stopped 12/24/18 1211)  morphine 4 MG/ML injection 4 mg (4 mg Intravenous Given 12/24/18 1102)  ondansetron (ZOFRAN) injection 4 mg (4 mg Intravenous Given 12/24/18 1102)  cefTRIAXone (ROCEPHIN) 1 g in sodium chloride 0.9 % 100 mL IVPB (0 g Intravenous Stopped 12/24/18 1341)  sodium chloride 0.9 % bolus 1,000 mL (0 mLs Intravenous Stopped 12/24/18 1341)  morphine 4 MG/ML injection 4 mg (4 mg Intravenous Given 12/24/18 1341)  sodium chloride 0.9 % bolus 1,000 mL (0 mLs Intravenous Stopped 12/24/18 1705)  morphine 4 MG/ML injection 4 mg (4 mg Intravenous Given 12/24/18 1554)  acetaminophen (TYLENOL) tablet 1,000 mg (1,000 mg Oral Given 12/24/18 1558)     Initial Impression / Assessment and Plan / ED Course  I have reviewed the triage vital signs and the nursing notes.  Pertinent labs & imaging results that were available during my care of the patient were reviewed by me and considered in my medical decision making (see chart for details).        Patient with acute pyelonephritis.  Leukocytosis at 12.0.  CMP shows BUN 21, creatinine 1.99, which is an AKI in comparison to 12/08/2018. UA shows large hematuria, large leukocytes, greater than 50 WBCs, many bacteria, 15 ketones, 30 protein.  Urine culture sent. I discussed repeat, but contrasted, imaging with the patient, as patient did have renal stone study 2 weeks ago (no left renal stones seen), and he declines. Considering significant testicular  tenderness, US scrotum also recommended but patient also declined. Fluids and single dose IV Rocephin given initially with anticipation of treating outpatient as patient did not wish to be admitted, however patient persistently tachycardic.  Pressures persistently elevated at 154/100.  Considering persistent tachycardia not in the presence of fever, lactic acid drawn following 2 L of fluid, 0.8. Patient's pain controlled in ED with morphine.  Patient given a total of 3 L with persistent tachycardia.  Blood cultures ordered at request of admitting doctor.  I discussed patient case with Dr. Isidoro Donning with [facility] who accepts patient for admission. I appreciate her assistance with the patient.  Patient will be transferred via [facility]. I discussed patient case with Dr. Rush Landmark who guided the patient's management and agrees with plan.  Patient put on cardiac monitoring and found to be in bigeminy.  Patient denies any chest pain or shortness of breath.  In comparison to 2016 EKG, this is new.  Final Clinical Impressions(s) / ED Diagnoses   Final diagnoses:  Pyelonephritis  Ventricular bigeminy seen on cardiac monitor    ED Discharge Orders    None           Emi Holes, Cordelia Poche 12/24/18 2238    Tegeler, Canary Brim, MD 12/25/18 [phone]

## 2018-12-24 NOTE — H&P (Signed)
History and Physical    Jack Carney YTW:[id number] DOB: [birth year] DOA: 12/24/2018  Referring MD/NP/PA:   PCP: Corwin Levins, MD   Patient coming from:  The patient is coming from home.  At baseline, pt is independent for most of ADL.        Chief Complaint: Fever, chills, left flank pain  HPI: Jack Carney is a 48 y.o. male with medical history significant of nonobstructive kidney stone, hypertension, hyperlipidemia, asthma, obesity, who presents with fever, chills, left flank pain.  Patient states that his symptoms started yesterday, including fever, chills, left flank pain. The left flank pain is intermittent, initially 8 out of 10 severity, currently pain-free, sharp, radiating to his suprapubic area as well as his testicles bilaterally. He denies any scrotal swelling or penile discharge.    Patient states that he has nausea and vomited on Saturday, which has resolved  now.  Currently no nause,  vomiting, abdominal pain or diarrhea.  Patient states he had vomiting on Saturday with possible streaks of blood, no recurrent hematemesis later.  Patient denies any chest pain, shortness breath, cough.  He does not have dysuria or burning on urination, but reports urge for urinating and pressure feeling in suprapubic area.  No gross hematuria.  # CT-per renal stone study on 12/08/2018: 1. Nonobstructing 2 mm calculus mid to lower pole region right kidney. No hydronephrosis or ureteral calculus on either side. Urinary bladder wall thickness within normal limits. 2. No bowel obstruction. No abscess in the abdomen or pelvis. Appendix appears normal. 3.  Rather minimal ventral hernia containing only fat  ED Course: pt was found to have WBC 12.0, lactic acid 0.8, lipase 30, positive urinalysis (cloudy appearance, large amount of leukocyte, many bacteria, WBC>50), AKI with creatinine 1.99, BUN 21, temperature 100.4, tachycardia, tachypnea, oxygen saturation 93 to 98% on room air.  Patient is  admitted to telemetry bed as inpatient.  Review of Systems:   General: has fevers, chills, no body weight gain,has fatigue HEENT: no blurry vision, hearing changes or sore throat Respiratory: no dyspnea, coughing, wheezing CV: no chest pain, no palpitations GI: has nausea, vomiting, no abdominal pain, diarrhea, constipation GU: no dysuria, burning on urination, increased urinary frequency, has urge for urination. Has right flank pain. Ext: no leg edema Neuro: no unilateral weakness, numbness, or tingling, no vision change or hearing loss Skin: no rash, no skin tear. MSK: No muscle spasm, no deformity, no limitation of range of movement in spin Heme: No easy bruising.  Travel history: No recent long distant travel.  Allergy:  Allergies  Allergen Reactions  . Sulfonamide Derivatives Nausea And Vomiting    Stomach pains  . Penicillins Rash    Did it involve swelling of the face/tongue/throat, SOB, or low BP? Y Did it involve sudden or severe rash/hives, skin peeling, or any reaction on the inside of your mouth or nose? N Did you need to seek medical attention at a hospital or doctor's office? U When did it last happen? Childhood Allergy If all above answers are "NO", may proceed with cephalosporin use.     Past Medical History:  Diagnosis Date  . ALLERGIC RHINITIS 05/08/2007   Qualifier: Diagnosis of  By: Jonny Ruiz MD, Len Blalock   . ASTHMA 05/08/2007   Qualifier: Diagnosis of  By: Jonny Ruiz MD, Len Blalock   . HYPERLIPIDEMIA 05/08/2007   Qualifier: Diagnosis of  By: Jonny Ruiz MD, Len Blalock     Past Surgical History:  Procedure Laterality Date  .  sinus surgury    . TONSILLECTOMY      Social History:  reports that he has never smoked. He has never used smokeless tobacco. He reports that he does not drink alcohol or use drugs.  Family History:  Family History  Problem Relation Age of Onset  . Hypertension Other      Prior to Admission medications   Medication Sig Start Date End Date  Taking? Authorizing Provider  amLODipine (NORVASC) 2.5 MG tablet Take 1 tablet (2.5 mg total) by mouth daily. 10/18/17  Yes Corwin Levins, MD  guaiFENesin (MUCINEX) 600 MG 12 hr tablet Take 1,200 mg by mouth 2 (two) times daily.   Yes [provider]    Physical Exam: Vitals:   12/24/18 1907 12/24/18 1956 12/24/18 2000 12/24/18 2056  BP: (!) 158/112  (!) 150/101   Pulse: (!) 101  (!) 105   Resp: 20  17   Temp: 98 F (36.7 C)  100.1 F (37.8 C)   TempSrc: Oral  Oral   SpO2: 93%  98% 93%  Weight:  117.4 kg    Height:  5\' 9"  (1.753 m)     General: Not in acute distress HEENT:       Eyes: PERRL, EOMI, no scleral icterus.       ENT: No discharge from the ears and nose, no pharynx injection, no tonsillar enlargement.        Neck: No JVD, no bruit, no mass felt. Heme: No neck lymph node enlargement. Cardiac: S1/S2, RRR, No murmurs, No gallops or rubs. Respiratory:  No rales, wheezing, rhonchi or rubs. GI: Soft, nondistended, nontender, no rebound pain, no organomegaly, BS present. GU: No gross hematuria. Has CVA tenderness. Ext: No pitting leg edema bilaterally. 2+DP/PT pulse bilaterally. Musculoskeletal: No joint deformities, No joint redness or warmth, no limitation of ROM in spin. Skin: No rashes.  Neuro: Alert, oriented X3, cranial nerves II-XII grossly intact, moves all extremities normally.  Psych: Patient is not psychotic, no suicidal or hemocidal ideation.  Labs on Admission: I have personally reviewed following labs and imaging studies  CBC: Recent Labs  Lab 12/24/18 1044  WBC 12.0*  NEUTROABS 9.3*  HGB 14.0  HCT 44.4  MCV 80.7  PLT 181   Basic Metabolic Panel: Recent Labs  Lab 12/24/18 1044  NA 134*  K 3.7  CL 103  CO2 22  GLUCOSE 119*  BUN 21*  CREATININE 1.99*  CALCIUM 8.7*   GFR: Estimated Creatinine Clearance: 56.8 mL/min (A) (by C-G formula based on SCr of 1.99 mg/dL (H)). Liver Function Tests: Recent Labs  Lab 12/24/18 1044  AST 21    ALT 23  ALKPHOS 62  BILITOT 1.0  PROT 7.8  ALBUMIN 4.3   Recent Labs  Lab 12/24/18 1044  LIPASE 30   No results for input(s): AMMONIA in the last 168 hours. Coagulation Profile: Recent Labs  Lab 12/24/18 2055  INR 1.0   Cardiac Enzymes: No results for input(s): CKTOTAL, CKMB, CKMBINDEX, TROPONINI in the last 168 hours. BNP (last 3 results) No results for input(s): PROBNP in the last 8760 hours. HbA1C: No results for input(s): HGBA1C in the last 72 hours. CBG: No results for input(s): GLUCAP in the last 168 hours. Lipid Profile: No results for input(s): CHOL, HDL, LDLCALC, TRIG, CHOLHDL, LDLDIRECT in the last 72 hours. Thyroid Function Tests: No results for input(s): TSH, T4TOTAL, FREET4, T3FREE, THYROIDAB in the last 72 hours. Anemia Panel: No results for input(s): VITAMINB12, FOLATE, FERRITIN, TIBC, IRON,  RETICCTPCT in the last 72 hours. Urine analysis:    Component Value Date/Time   COLORURINE YELLOW 12/24/2018 0920   APPEARANCEUR CLOUDY (A) 12/24/2018 0920   LABSPEC 1.020 12/24/2018 0920   PHURINE 6.0 12/24/2018 0920   GLUCOSEU NEGATIVE 12/24/2018 0920   HGBUR LARGE (A) 12/24/2018 0920   BILIRUBINUR NEGATIVE 12/24/2018 0920   KETONESUR 15 (A) 12/24/2018 0920   PROTEINUR 30 (A) 12/24/2018 0920   NITRITE NEGATIVE 12/24/2018 0920   LEUKOCYTESUR LARGE (A) 12/24/2018 0920   Sepsis Labs: (procalcitonin:4,lacticidven:4) )No results found for this or any previous visit (from the past 240 hour(s)).   Radiological Exams on Admission: No results found.   EKG: Independently reviewed.  Sinus rhythm, QTC 417, LAE, PVC with bigeminy, nonspecific T wave change.  Assessment/Plan Principal Problem:   Acute pyelonephritis Active Problems:   Hyperlipidemia   Asthma   Sepsis (HCC)   AKI (acute kidney injury) (HCC)   Essential hypertension   Ventricular bigeminy seen on cardiac monitor   Sepsis due to acute pyelonephritis: Patient's flank pain, positive  U/A result are consistent with the diagnosis of acute pyelonephritis.  Pt meets criteria for sepsis with leukocytosis, fever, tachycardia and tachypnea.  Currently hemodynamically stable.  Given history of nonobstructive kidney stone, there is a possibility that the patient may have obstructive stone complicated with infection today. I discussed with the patient about repeaging CT-renal stone scan to r/o obstructive stone. Patient states that he already feels better and dose not want to be re-scanned now. He agreed that if he clinically deteriorates, he will agree to get CT scan again.   - Admit to telemetry bed as inpt -  Ceftriaxone by IV - Follow up results of urine and blood cx and amend antibiotic regimen if needed per sensitivity results - prn Zofran for nausea and morphine for pain - will get Procalcitonin and trend lactic acid levels per sepsis protocol. - IVF: 3.5L of NS bolus in ED, followed by 125 cc/h  - will keep pt NPO after MN in case deteriorating and need procedure (in case patient has obstructive stone)  Hyperlipidemia: not taking med at home -f/u with PCP  Asthma: stable -prn Xopenex nebs  Essential hypertension: Bp 171/11-->150/101.  -hold amlodipine, since patient is at high risk of developing hypotension due to sepsis -IV hydralazine.  AKI (acute kidney injury) [facility]): Creatinine 1.99, BUN 21, likely due to pyelonephritis, currently cannot completely rule out obstructive stone, but patient does not want to do CT scan -IV fluid: 0.5 L normal saline bolus, then 125 cc/h -Follow-up BMP  Ventricular bigeminy seen on cardiac monitor: Patient is asymptomatic.  Etiology is not clear, but possibly due to sepsis. -Check TSH in AM -tele monitoring    Inpatient status:  # Patient requires inpatient status due to high intensity of service, high risk for further deterioration and high frequency of surveillance required.  I certify that at the point of admission it is my  clinical judgment that the patient will require inpatient hospital care spanning beyond 2 midnights from the point of admission.  . This patient has multiple chronic comorbidities including nonobstructive kidney stone, hypertension, hyperlipidemia, asthma, obesity, who presents with fever, chills, left flank pain. . Now patient presents with sepsis due to pyelonephritis . The worrisome physical exam findings include left-sided flank pain and CVA tenderness . The initial radiographic and laboratory data are worrisome because of positive urinalysis, leukocytosis, AKI . Current medical needs: please see my assessment and plan . Predictability of an  adverse outcome (risk): Patient has a history of nonobstructive kidney stone, now presents with sepsis secondary to pyelonephritis, currently cannot completely rule out obstructive stone.  If patient deteriorates, patient needs to be rescanned. If proved to have obstructing stone, patient may need to have ureteral stent placement or procedure by urologist. He is at high risk of developing hypotension or septic shock due to pyelonephritis.  Patient will need to be treated in hospital for at least 2 days.      DVT ppx: SCD Code Status: Full code Family Communication:  Yes, patient's  girlfriend  at bed side Disposition Plan:  Anticipate discharge back to previous home environment Consults called:  none Admission status: Inpatient/tele    Date of Service 12/24/2018    Lorretta Harp [facility]   If 7PM-7AM, please contact night-coverage [email or web address] Password [facility] 12/24/2018, 9:39 PM

## 2018-12-24 NOTE — ED Triage Notes (Signed)
Reports left flank pain and left testicle pain since yesterday.  Reports no BM since Friday.  States this abnormal for him.  States urine is now dark in color despite drinking water.

## 2018-12-24 NOTE — Telephone Encounter (Signed)
Pt seen in ED 12/08/2018 for left flank pain. Pt reports "Had kidney stone" which he reports he passed last Wednesday.  States no further issues until Saturday. Saturday "Just felt bad." Sunday developed left lower back pain, radiated to left lower abdomen and left testicle. States 8/10  pain, constant and throbbing.  Denies swelling of testicle.States urine dark "And I've been drinking a lot of water." Reports fever, no thermometer to measure but "Hot, then chills."  Pt sounds distressed. Directed pt to ED. Care advise per protocol. Pt states he will follow disposition, has driver.   Reason for Disposition . [1] Pain or burning with passing urine (urination) AND [2] flank pain (i.e., in side, below ribs and above hip)  Answer Assessment - Initial Assessment Questions 1. ONSET: "When did the pain begin?"      Saturday 2. LOCATION: "Where does it hurt?" (upper, mid or lower back)     Lower left side 3. SEVERITY: "How bad is the pain?"  (e.g., Scale 1-10; mild, moderate, or severe)   - MILD (1-3): doesn't interfere with normal activities    - MODERATE (4-7): interferes with normal activities or awakens from sleep    - SEVERE (8-10): excruciating pain, unable to do any normal activities      8/10 4. PATTERN: "Is the pain constant?" (e.g., yes, no; constant, intermittent)     constant 5. RADIATION: "Does the pain shoot into your legs or elsewhere?"     Left lower abdomen, left testicle 6. CAUSE:  "What do you think is causing the back pain?"     Kidneys 7. BACK OVERUSE:  "Any recent lifting of heavy objects, strenuous work or exercise?"    no 8. MEDICATIONS: "What have you taken so far for the pain?" (e.g., nothing, acetaminophen, NSAIDS)     nothing 9. NEUROLOGIC SYMPTOMS: "Do you have any weakness, numbness, or problems with bowel/bladder control?"     no 10. OTHER SYMPTOMS: "Do you have any other symptoms?" (e.g., fever, abdominal pain, burning with urination, blood in urine)      Abdominal  pain, urine dark. Fever (feels hot, flushed)  Protocols used: BACK PAIN-A-AH

## 2018-12-24 NOTE — ED Notes (Signed)
Pt given ice chips per request

## 2018-12-24 NOTE — ED Notes (Signed)
Blood culture not drawn at this time.  EDP aware

## 2018-12-24 NOTE — ED Notes (Signed)
ED Provider at bedside. 

## 2018-12-24 NOTE — ED Notes (Addendum)
Called report to [place], [place] RN Morton Stall RN

## 2018-12-24 NOTE — Telephone Encounter (Signed)
Noted.  Dr. John FYI 

## 2018-12-25 LAB — HIV ANTIBODY (ROUTINE TESTING W REFLEX): HIV Screen 4th Generation wRfx: NONREACTIVE

## 2018-12-25 LAB — BASIC METABOLIC PANEL
Anion gap: 7 (ref 5–15)
BUN: 15 mg/dL (ref 6–20)
CO2: 20 mmol/L — ABNORMAL LOW (ref 22–32)
Calcium: 7.9 mg/dL — ABNORMAL LOW (ref 8.9–10.3)
Chloride: 108 mmol/L (ref 98–111)
Creatinine, Ser: 1.77 mg/dL — ABNORMAL HIGH (ref 0.61–1.24)
GFR calc Af Amer: 51 mL/min — ABNORMAL LOW (ref >60)
GFR calc non Af Amer: 44 mL/min — ABNORMAL LOW (ref >60)
GLUCOSE: 128 mg/dL — AB (ref 70–99)
Potassium: 3.8 mmol/L (ref 3.5–5.1)
Sodium: 135 mmol/L (ref 135–145)

## 2018-12-25 LAB — CBC
HCT: 39.3 % (ref 39.0–52.0)
Hemoglobin: 12.3 g/dL — ABNORMAL LOW (ref 13.0–17.0)
MCH: 26.1 pg (ref 26.0–34.0)
MCHC: 31.3 g/dL (ref 30.0–36.0)
MCV: 83.3 fL (ref 80.0–100.0)
Platelets: 154 10*3/uL (ref 150–400)
RBC: 4.72 MIL/uL (ref 4.22–5.81)
RDW: 14.7 % (ref 11.5–15.5)
WBC: 8.1 10*3/uL (ref 4.0–10.5)
nRBC: 0 % (ref 0.0–0.2)

## 2018-12-25 LAB — TSH: TSH: 1.62 u[IU]/mL (ref 0.350–4.500)

## 2018-12-25 MED ORDER — LORATADINE 10 MG PO TABS
10.0000 mg | ORAL_TABLET | Freq: Every day | ORAL | Status: DC
  Administered 2018-12-25 – 2018-12-26 (×2): 10 mg via ORAL
  Filled 2018-12-25 (×2): qty 1

## 2018-12-25 MED ORDER — TAMSULOSIN HCL 0.4 MG PO CAPS
0.4000 mg | ORAL_CAPSULE | Freq: Every day | ORAL | Status: DC
  Administered 2018-12-25 – 2018-12-26 (×2): 0.4 mg via ORAL
  Filled 2018-12-25 (×2): qty 1

## 2018-12-25 MED ORDER — LEVALBUTEROL HCL 1.25 MG/0.5ML IN NEBU: 1.2500 mg | INHALATION_SOLUTION | Freq: Four times a day (QID) | RESPIRATORY_TRACT | Status: DC | PRN

## 2018-12-25 MED ORDER — AMLODIPINE BESYLATE 5 MG PO TABS
2.5000 mg | ORAL_TABLET | Freq: Every day | ORAL | Status: DC
  Administered 2018-12-25 – 2018-12-26 (×2): 2.5 mg via ORAL
  Filled 2018-12-25 (×2): qty 1

## 2018-12-25 NOTE — Progress Notes (Signed)
PROGRESS NOTE    Jack Carney  GUR:[id number] DOB: 28-Oct-1970 DOA: 12/24/2018 PCP: Corwin Levins, MD    Brief Narrative:  48 y.o. male with medical history significant of nonobstructive kidney stone, hypertension, hyperlipidemia, asthma, obesity, who presents with fever, chills, left flank pain.  Patient states that his symptoms started yesterday, including fever, chills, left flank pain. The left flank pain is intermittent, initially 8 out of 10 severity, currently pain-free, sharp, radiating to his suprapubic area as well as his testicles bilaterally. He denies any scrotal swelling or penile discharge.   Patient states that he has nausea and vomited on Saturday, which has resolved  now.  Currently no nause,  vomiting, abdominal pain or diarrhea.  Patient states he had vomiting on Saturday with possible streaks of blood, no recurrent hematemesis later.  Patient denies any chest pain, shortness breath, cough.  He does not have dysuria or burning on urination, but reports urge for urinating and pressure feeling in suprapubic area.  No gross hematuria.  # CT-per renal stone study on 12/08/2018: 1. Nonobstructing 2 mm calculus mid to lower pole region right kidney. No hydronephrosis or ureteral calculus on either side. Urinary bladder wall thickness within normal limits. 2. No bowel obstruction. No abscess in the abdomen or pelvis. Appendix appears normal. 3. Rather minimal ventral hernia containing only fat  ED Course: pt was found to have WBC 12.0, lactic acid 0.8, lipase 30, positive urinalysis (cloudy appearance, large amount of leukocyte, many bacteria, WBC>50), AKI with creatinine 1.99, BUN 21, temperature 100.4, tachycardia, tachypnea, oxygen saturation 93 to 98% on room air.  Patient is admitted to telemetry bed as inpatient.  Assessment & Plan:   Principal Problem:   Acute pyelonephritis Active Problems:   Hyperlipidemia   Asthma   Sepsis (HCC)   AKI (acute kidney injury)  (HCC)   Essential hypertension   Ventricular bigeminy seen on cardiac monitor   Sepsis due to acute pyelonephritis:  -Presented septic with UA suggestive of UTI, concerns for pyelo -Pt with known R sided non-obstructive kidney stones -Currently on empiric rocephin -Urine culture is pending -clinically improved, however was febrile this AM at 0612 -Will repeat cbc in AM -recommend completing 5-7 days of abx -recommend follow up with Urology after completion of tx  Renal calculi -Noted on recent CT abd -Have started pt on flomax -Recommend close Urologic follow up after completion of abx tx  Hyperlipidemia: not taking med at home -f/u with PCP  Asthma: stable -prn Xopenex nebs -Stable and on room air currently  Essential hypertension: Bp 171/11-->150/101.  -BP elevated. Amlodipine was held at time of admission secondary to concerns of developing septic shock -Will resume home BP med -IV hydralazine.  AKI (acute kidney injury) [facility]): Creatinine 1.99, BUN 21, likely due to dehydration -Improved with IVF hydration -Repeat bmet in AM  Ventricular bigeminy seen on cardiac monitor: Patient is asymptomatic.  -Etiology is not clear, but possibly due to sepsis. -TSH reviewed, normal -Hemodynamically stable  DVT prophylaxis: SCD's Code Status: Full Family Communication: Pt in room, family at bedside Disposition Plan: Possible d/c home if afebrile >24hrs  Consultants:     Procedures:     Antimicrobials: Anti-infectives (From admission, onward)   Start     Dose/Rate Route Frequency Ordered Stop   12/25/18 1200  cefTRIAXone (ROCEPHIN) 2 g in sodium chloride 0.9 % 100 mL IVPB     2 g 200 mL/hr over 30 Minutes Intravenous Every 24 hours 12/24/18 2036  12/24/18 1200  cefTRIAXone (ROCEPHIN) 1 g in sodium chloride 0.9 % 100 mL IVPB     1 g 200 mL/hr over 30 Minutes Intravenous  Once 12/24/18 1151 12/24/18 1341       Subjective: Feeling better today. Fevers  this AM  Objective: Vitals:   12/25/18 0120 12/25/18 0311 12/25/18 0612 12/25/18 1355  BP:   (!) 153/97 (!) 180/94  Pulse:   85 (!) 103  Resp:   19 18  Temp: (!) 101.9 F (38.8 C) 98.7 F (37.1 C) (!) 101.3 F (38.5 C) 99.4 F (37.4 C)  TempSrc: Oral  Oral Oral  SpO2:   96% 100%  Weight:      Height:        Intake/Output Summary (Last 24 hours) at 12/25/2018 1428 Last data filed at 12/25/2018 0600 Gross per 24 hour  Intake 1438.78 ml  Output 1850 ml  Net -411.22 ml   Filed Weights   12/24/18 0916 12/24/18 1956  Weight: 115.7 kg 117.4 kg    Examination:  General exam: Appears calm and comfortable  Respiratory system: Clear to auscultation. Respiratory effort normal. Cardiovascular system: S1 & S2 heard, RRR Gastrointestinal system: Abdomen is nondistended, soft and nontender. No organomegaly or masses felt. Normal bowel sounds heard. Central nervous system: Alert and oriented. No focal neurological deficits. Extremities: Symmetric 5 x 5 power. Skin: No rashes, lesions or ulcers Psychiatry: Judgement and insight appear normal. Mood & affect appropriate.   Data Reviewed: I have personally reviewed following labs and imaging studies  CBC: Recent Labs  Lab 12/24/18 1044 12/25/18 0759  WBC 12.0* 8.1  NEUTROABS 9.3*  --   HGB 14.0 12.3*  HCT 44.4 39.3  MCV 80.7 83.3  PLT 181 154   Basic Metabolic Panel: Recent Labs  Lab 12/24/18 1044 12/25/18 0759  NA 134* 135  K 3.7 3.8  CL 103 108  CO2 22 20*  GLUCOSE 119* 128*  BUN 21* 15  CREATININE 1.99* 1.77*  CALCIUM 8.7* 7.9*   GFR: Estimated Creatinine Clearance: 63.8 mL/min (A) (by C-G formula based on SCr of 1.77 mg/dL (H)). Liver Function Tests: Recent Labs  Lab 12/24/18 1044  AST 21  ALT 23  ALKPHOS 62  BILITOT 1.0  PROT 7.8  ALBUMIN 4.3   Recent Labs  Lab 12/24/18 1044  LIPASE 30   No results for input(s): AMMONIA in the last 168 hours. Coagulation Profile: Recent Labs  Lab 12/24/18 2055    INR 1.0   Cardiac Enzymes: No results for input(s): CKTOTAL, CKMB, CKMBINDEX, TROPONINI in the last 168 hours. BNP (last 3 results) No results for input(s): PROBNP in the last 8760 hours. HbA1C: No results for input(s): HGBA1C in the last 72 hours. CBG: No results for input(s): GLUCAP in the last 168 hours. Lipid Profile: No results for input(s): CHOL, HDL, LDLCALC, TRIG, CHOLHDL, LDLDIRECT in the last 72 hours. Thyroid Function Tests: Recent Labs    12/25/18 0759  TSH 1.620   Anemia Panel: No results for input(s): VITAMINB12, FOLATE, FERRITIN, TIBC, IRON, RETICCTPCT in the last 72 hours. Sepsis Labs: Recent Labs  Lab 12/24/18 1451 12/24/18 2055 12/24/18 2325  PROCALCITON  --  0.93  --   LATICACIDVEN 0.8 1.2 1.3    No results found for this or any previous visit (from the past 240 hour(s)).   Radiology Studies: No results found.  Scheduled Meds: . levalbuterol  1.25 mg Nebulization BID  . loratadine  10 mg Oral Daily  . tamsulosin  0.4 mg Oral Daily   Continuous Infusions: . sodium chloride 125 mL/hr at 12/25/18 1053  . cefTRIAXone (ROCEPHIN)  IV 2 g (12/25/18 1156)     LOS: 1 day   Rickey Barbara, MD [facility] Pager On Amion  If 7PM-7AM, please contact night-coverage 12/25/2018, 2:28 PM

## 2018-12-26 DIAGNOSIS — A498 Other bacterial infections of unspecified site: Secondary | ICD-10-CM

## 2018-12-26 DIAGNOSIS — N12 Tubulo-interstitial nephritis, not specified as acute or chronic: Secondary | ICD-10-CM

## 2018-12-26 LAB — URINE CULTURE: Culture: >=100000 — AB

## 2018-12-26 MED ORDER — TAMSULOSIN HCL 0.4 MG PO CAPS: 0.4000 mg | ORAL_CAPSULE | Freq: Every day | ORAL | 0 refills | Status: DC

## 2018-12-26 MED ORDER — CIPROFLOXACIN HCL 500 MG PO TABS: 500.0000 mg | ORAL_TABLET | Freq: Two times a day (BID) | ORAL | 0 refills | Status: AC

## 2018-12-26 NOTE — Discharge Instructions (Signed)
Follow with Corwin Levins, MD in 5-7 days  Please get a complete blood count and chemistry panel checked by your Primary MD at your next visit, and again as instructed by your Primary MD. Please get your medications reviewed and adjusted by your Primary MD.  Please request your Primary MD to go over all Hospital Tests and Procedure/Radiological results at the follow up, please get all Hospital records sent to your Prim MD by signing hospital release before you go home.  In some cases, there will be blood work, cultures and biopsy results pending at the time of your discharge. Please request that your primary care M.D. goes through all the records of your hospital data and follows up on these results.  If you had Pneumonia of Lung problems at the Hospital: Please get a 2 view Chest X ray done in 6-8 weeks after hospital discharge or sooner if instructed by your Primary MD.  If you have Congestive Heart Failure: Please call your Cardiologist or Primary MD anytime you have any of the following symptoms:  1) 3 pound weight gain in 24 hours or 5 pounds in 1 week  2) shortness of breath, with or without a dry hacking cough  3) swelling in the hands, feet or stomach  4) if you have to sleep on extra pillows at night in order to breathe  Follow cardiac low salt diet and 1.5 lit/day fluid restriction.  If you have diabetes Accuchecks 4 times/day, Once in AM empty stomach and then before each meal. Log in all results and show them to your primary doctor at your next visit. If any glucose reading is under 80 or above 300 call your primary MD immediately.  If you have Seizure/Convulsions/Epilepsy: Please do not drive, operate heavy machinery, participate in activities at heights or participate in high speed sports until you have seen by Primary MD or a Neurologist and advised to do so again.  If you had Gastrointestinal Bleeding: Please ask your Primary MD to check a complete blood count within one  week of discharge or at your next visit. Your endoscopic/colonoscopic biopsies that are pending at the time of discharge, will also need to followed by your Primary MD.  Get Medicines reviewed and adjusted. Please take all your medications with you for your next visit with your Primary MD  Please request your Primary MD to go over all hospital tests and procedure/radiological results at the follow up, please ask your Primary MD to get all Hospital records sent to his/her office.  If you experience worsening of your admission symptoms, develop shortness of breath, life threatening emergency, suicidal or homicidal thoughts you must seek medical attention immediately by calling 911 or calling your MD immediately  if symptoms less severe.  You must read complete instructions/literature along with all the possible adverse reactions/side effects for all the Medicines you take and that have been prescribed to you. Take any new Medicines after you have completely understood and accpet all the possible adverse reactions/side effects.   Do not drive or operate heavy machinery when taking Pain medications.   Do not take more than prescribed Pain, Sleep and Anxiety Medications  Special Instructions: If you have smoked or chewed Tobacco  in the last 2 yrs please stop smoking, stop any regular Alcohol  and or any Recreational drug use.  Wear Seat belts while driving.  Please note You were cared for by a hospitalist during your hospital stay. If you have any questions about your  discharge medications or the care you received while you were in the hospital after you are discharged, you can call the unit and asked to speak with the hospitalist on call if the hospitalist that took care of you is not available. Once you are discharged, your primary care physician will handle any further medical issues. Please note that NO REFILLS for any discharge medications will be authorized once you are discharged, as it is  imperative that you return to your primary care physician (or establish a relationship with a primary care physician if you do not have one) for your aftercare needs so that they can reassess your need for medications and monitor your lab values.  You can reach the hospitalist office at phone [phone] or fax [phone]   If you do not have a primary care physician, you can call [phone] for a physician referral.  Activity: As tolerated with Full fall precautions use walker/cane & assistance as needed    Diet: regular  Disposition Home

## 2018-12-26 NOTE — Plan of Care (Signed)

## 2018-12-26 NOTE — Discharge Summary (Signed)
Physician Discharge Summary  Jack Carney ZOX:[id number] DOB: [birth year] DOA: 12/24/2018  PCP: Corwin Levins, MD  Admit date: 12/24/2018 Discharge date: 12/26/2018  Admitted From: home Disposition:  home  Recommendations for Outpatient Follow-up:  1. Follow up with PCP in 1 week for repeat BMP  Home Health: none Equipment/Devices: none  Discharge Condition: stable CODE STATUS: Full code Diet recommendation: regular  HPI: Per admitting MD, Jack Carney is a 48 y.o. male with medical history significant of nonobstructive kidney stone, hypertension, hyperlipidemia, asthma, obesity, who presents with fever, chills, left flank pain. Patient states that his symptoms started yesterday, including fever, chills, left flank pain. The left flank pain is intermittent, initially 8 out of 10 severity, currently pain-free, sharp, radiating to his suprapubic area as well as his testicles bilaterally. He denies any scrotal swelling or penile discharge.   Patient states that he has nausea and vomited on Saturday, which has resolved  now.  Currently no nause,  vomiting, abdominal pain or diarrhea.  Patient states he had vomiting on Saturday with possible streaks of blood, no recurrent hematemesis later.  Patient denies any chest pain, shortness breath, cough.  He does not have dysuria or burning on urination, but reports urge for urinating and pressure feeling in suprapubic area.  No gross hematuria. # CT-per renal stone study on 12/08/2018: 1. Nonobstructing 2 mm calculus mid to lower pole region right kidney. No hydronephrosis or ureteral calculus on either side. Urinary bladder wall thickness within normal limits. 2. No bowel obstruction. No abscess in the abdomen or pelvis. Appendix appears normal. 3. Rather minimal ventral hernia containing only fat  ED Course: pt was found to have WBC 12.0, lactic acid 0.8, lipase 30, positive urinalysis (cloudy appearance, large amount of leukocyte, many  bacteria, WBC>50), AKI with creatinine 1.99, BUN 21, temperature 100.4, tachycardia, tachypnea, oxygen saturation 93 to 98% on room air.  Patient is admitted to telemetry bed as inpatient.  Hospital Course: Sepsis due to UTI/pyelonephritis -patient has been to the hospital febrile, with evidence of a UTI as well as left flank pain.  He was placed on empiric ceftriaxone and urine cultures were sent.  His microbiology came back with Citrobacter koseri, pansensitive, and he was narrowed to ciprofloxacin and will complete a 10-day course.  His fever resolved and he is currently afebrile.  His leukocytosis also resolved.  He is back to baseline, and will be discharged home in stable condition. Acute kidney injury -patient's creatinine has run 1.3-1.47 in the past, his creatinine was 2 on admission and improved with IV fluids. He is able to tolerate p.o. intake, I advised him to continue hydration and recommend lab work check within a week in his PCPs office. Nephrolithiasis -based on symptomatology, colicky flank pain before admission suspect he may have passed a stone prior to coming in.  His flank pain is completely resolved, he does have a nonobstructive calculus which is only 2 mm.  He was placed on Flomax, recommended outpatient follow-up with urology Hypertension-resume home medications on discharge History of asthma-stable, no wheezing, on room air   Discharge Diagnoses:  Principal Problem:   Acute pyelonephritis Active Problems:   Hyperlipidemia   Asthma   Sepsis (HCC)   AKI (acute kidney injury) (HCC)   Essential hypertension   Ventricular bigeminy seen on cardiac monitor     Discharge Instructions   Allergies as of 12/26/2018      Reactions   Sulfonamide Derivatives Nausea And Vomiting   Stomach  pains   Penicillins Rash   Did it involve swelling of the face/tongue/throat, SOB, or low BP? Y Did it involve sudden or severe rash/hives, skin peeling, or any reaction on the inside of  your mouth or nose? N Did you need to seek medical attention at a hospital or doctor's office? U When did it last happen? Childhood Allergy If all above answers are "NO", may proceed with cephalosporin use.      Medication List    TAKE these medications   amLODipine 2.5 MG tablet Commonly known as:  NORVASC Take 1 tablet (2.5 mg total) by mouth daily.   ciprofloxacin 500 MG tablet Commonly known as:  CIPRO Take 1 tablet (500 mg total) by mouth 2 (two) times daily for 10 days.   guaiFENesin 600 MG 12 hr tablet Commonly known as:  MUCINEX Take 1,200 mg by mouth 2 (two) times daily.   tamsulosin 0.4 MG Caps capsule Commonly known as:  FLOMAX Take 1 capsule (0.4 mg total) by mouth daily.      Follow-up Information    Corwin Levins, MD Follow up in 1 week(s).   Specialties:  Internal Medicine, Radiology Why:  have labs rechecked Contact information: [address] [place] [address] [phone]        [facility]. Schedule an appointment as soon as possible for a visit in 1 month(s).   Contact information: [address] [place] [address] [place] [address] [phone]          Consultations:  None   Procedures/Studies:  Ct Renal Stone Study  Result Date: 12/08/2018 CLINICAL DATA:  Left flank/abdominal pain EXAM: CT ABDOMEN AND PELVIS WITHOUT CONTRAST TECHNIQUE: Multidetector CT imaging of the abdomen and pelvis was performed following the standard protocol without oral or IV contrast. COMPARISON:  December 04, 2017 FINDINGS: Lower chest: There is mild bibasilar atelectatic change. No lung base edema or consolidation evident. Hepatobiliary: No liver lesions are appreciable on this noncontrast enhanced study. Gallbladder wall is not appreciably thickened. There is no biliary duct dilatation. Pancreas: No pancreatic mass or inflammatory focus. Spleen: No splenic lesions are evident. Adrenals/Urinary Tract: Adrenals bilaterally  appear unremarkable. Kidneys bilaterally show no evident mass or hydronephrosis on either side. There is a 2 mm calculus in the mid to lower right kidney. There is no evident ureteral calculus on either side. The urinary bladder is midline with wall thickness within normal limits. Stomach/Bowel: There is no appreciable bowel wall or mesenteric thickening. There is no evident bowel obstruction. There is no free air or portal venous air. Vascular/Lymphatic: No abdominal aortic aneurysm. No evident vascular lesions on this noncontrast enhanced study. There is no evident adenopathy in the abdomen or pelvis. Reproductive: Prostate and seminal vesicles appear normal in size and contour. There is no evident pelvic mass. Other: The appendix appears unremarkable. There is no abscess or ascites in the abdomen or pelvis. There is a rather minimal ventral hernia containing only fat. Musculoskeletal: There are no blastic or lytic bone lesions. There is degenerative change at a left-sided L5-S1 assimilation joint. No intramuscular lesions are evident. IMPRESSION: 1. Nonobstructing 2 mm calculus mid to lower pole region right kidney. No hydronephrosis or ureteral calculus on either side. Urinary bladder wall thickness within normal limits. 2. No bowel obstruction. No abscess in the abdomen or pelvis. Appendix appears normal. 3.  Rather minimal ventral hernia containing only fat Electronically Signed   By: Bretta Bang III M.D.  On: 12/08/2018 05:21      Subjective: - no chest pain, shortness of breath, no abdominal pain, nausea or vomiting.   Discharge Exam: BP (!) 178/89 (BP Location: Right Arm)   Pulse 87   Temp 97.6 F (36.4 C) (Oral)   Resp 18   Ht 5\' 9"  (1.753 m)   Wt 117.4 kg   SpO2 98%   BMI 38.23 kg/m   General: Pt is alert, awake, not in acute distress Cardiovascular: RRR, S1/S2 +, no rubs, no gallops Respiratory: CTA bilaterally, no wheezing, no rhonchi Abdominal: Soft, NT, ND, bowel sounds  + Extremities: no edema, no cyanosis    The results of significant diagnostics from this hospitalization (including imaging, microbiology, ancillary and laboratory) are listed below for reference.     Microbiology: Recent Results (from the past 240 hour(s))  Urine culture     Status: Abnormal   Collection Time: 12/24/18  9:20 AM  Result Value Ref Range Status   Specimen Description   Final    URINE, CLEAN CATCH Performed at [facility] [place], [address] [place], [place] [address]    Special Requests   Final    NONE Performed at [facility] [place], [address] [place], [place] [address]    Culture >=100,000 COLONIES/mL CITROBACTER KOSERI (A)  Final   Report Status 12/26/2018 FINAL  Final   Organism ID, Bacteria CITROBACTER KOSERI (A)  Final      Susceptibility   Citrobacter koseri - MIC*    CEFAZOLIN <=4 SENSITIVE Sensitive     CEFTRIAXONE <=1 SENSITIVE Sensitive     CIPROFLOXACIN <=0.25 SENSITIVE Sensitive     GENTAMICIN <=1 SENSITIVE Sensitive     IMIPENEM <=0.25 SENSITIVE Sensitive     NITROFURANTOIN 32 SENSITIVE Sensitive     TRIMETH/SULFA <=20 SENSITIVE Sensitive     PIP/TAZO <=4 SENSITIVE Sensitive     * >=100,000 COLONIES/mL CITROBACTER KOSERI     Labs: BNP (last 3 results) No results for input(s): BNP in the last 8760 hours. Basic Metabolic Panel: Recent Labs  Lab 12/24/18 1044 12/25/18 0759  NA 134* 135  K 3.7 3.8  CL 103 108  CO2 22 20*  GLUCOSE 119* 128*  BUN 21* 15  CREATININE 1.99* 1.77*  CALCIUM 8.7* 7.9*   Liver Function Tests: Recent Labs  Lab 12/24/18 1044  AST 21  ALT 23  ALKPHOS 62  BILITOT 1.0  PROT 7.8  ALBUMIN 4.3   Recent Labs  Lab 12/24/18 1044  LIPASE 30   No results for input(s): AMMONIA in the last 168 hours. CBC: Recent Labs  Lab 12/24/18 1044 12/25/18 0759  WBC 12.0* 8.1  NEUTROABS 9.3*  --   HGB 14.0 12.3*  HCT 44.4 39.3  MCV 80.7 83.3  PLT 181 154   Cardiac Enzymes: No results for  input(s): CKTOTAL, CKMB, CKMBINDEX, TROPONINI in the last 168 hours. BNP: Invalid input(s): POCBNP CBG: No results for input(s): GLUCAP in the last 168 hours. D-Dimer No results for input(s): DDIMER in the last 72 hours. Hgb A1c No results for input(s): HGBA1C in the last 72 hours. Lipid Profile No results for input(s): CHOL, HDL, LDLCALC, TRIG, CHOLHDL, LDLDIRECT in the last 72 hours. Thyroid function studies Recent Labs    12/25/18 0759  TSH 1.620   Anemia work up No results for input(s): VITAMINB12, FOLATE, FERRITIN, TIBC, IRON, RETICCTPCT in the last 72 hours. Urinalysis    Component Value Date/Time   COLORURINE YELLOW 12/24/2018 0920  APPEARANCEUR CLOUDY (A) 12/24/2018 0920   LABSPEC 1.020 12/24/2018 0920   PHURINE 6.0 12/24/2018 0920   GLUCOSEU NEGATIVE 12/24/2018 0920   HGBUR LARGE (A) 12/24/2018 0920   BILIRUBINUR NEGATIVE 12/24/2018 0920   KETONESUR 15 (A) 12/24/2018 0920   PROTEINUR 30 (A) 12/24/2018 0920   NITRITE NEGATIVE 12/24/2018 0920   LEUKOCYTESUR LARGE (A) 12/24/2018 0920   Sepsis Labs Invalid input(s): PROCALCITONIN,  WBC,  LACTICIDVEN  FURTHER DISCHARGE INSTRUCTIONS:   Get Medicines reviewed and adjusted: Please take all your medications with you for your next visit with your Primary MD   Laboratory/radiological data: Please request your Primary MD to go over all hospital tests and procedure/radiological results at the follow up, please ask your Primary MD to get all Hospital records sent to his/her office.   In some cases, they will be blood work, cultures and biopsy results pending at the time of your discharge. Please request that your primary care M.D. goes through all the records of your hospital data and follows up on these results.   Also Note the following: If you experience worsening of your admission symptoms, develop shortness of breath, life threatening emergency, suicidal or homicidal thoughts you must seek medical attention  immediately by calling 911 or calling your MD immediately  if symptoms less severe.   You must read complete instructions/literature along with all the possible adverse reactions/side effects for all the Medicines you take and that have been prescribed to you. Take any new Medicines after you have completely understood and accpet all the possible adverse reactions/side effects.    Do not drive when taking Pain medications or sleeping medications (Benzodaizepines)   Do not take more than prescribed Pain, Sleep and Anxiety Medications. It is not advisable to combine anxiety,sleep and pain medications without talking with your primary care practitioner   Special Instructions: If you have smoked or chewed Tobacco  in the last 2 yrs please stop smoking, stop any regular Alcohol  and or any Recreational drug use.   Wear Seat belts while driving.   Please note: You were cared for by a hospitalist during your hospital stay. Once you are discharged, your primary care physician will handle any further medical issues. Please note that NO REFILLS for any discharge medications will be authorized once you are discharged, as it is imperative that you return to your primary care physician (or establish a relationship with a primary care physician if you do not have one) for your post hospital discharge needs so that they can reassess your need for medications and monitor your lab values.  Time coordinating discharge: 35 minutes  SIGNED:  Pamella Pert, PA-S 12/26/2018, 2:49 PM

## 2019-01-11 ENCOUNTER — Encounter: Payer: Self-pay | Admitting: Internal Medicine

## 2019-01-11 ENCOUNTER — Ambulatory Visit: Payer: BC Managed Care – PPO | Admitting: Internal Medicine

## 2019-01-11 ENCOUNTER — Other Ambulatory Visit: Payer: Self-pay

## 2019-01-11 VITALS — BP 146/98 | HR 89 | Temp 98.4°F | Ht 69.0 in | Wt 249.0 lb

## 2019-01-11 DIAGNOSIS — N10 Acute tubulo-interstitial nephritis: Secondary | ICD-10-CM

## 2019-01-11 DIAGNOSIS — I10 Essential (primary) hypertension: Secondary | ICD-10-CM | POA: Diagnosis not present

## 2019-01-11 DIAGNOSIS — Z0000 Encounter for general adult medical examination without abnormal findings: Secondary | ICD-10-CM

## 2019-01-11 DIAGNOSIS — J4520 Mild intermittent asthma, uncomplicated: Secondary | ICD-10-CM | POA: Diagnosis not present

## 2019-01-11 MED ORDER — ALBUTEROL SULFATE HFA 108 (90 BASE) MCG/ACT IN AERS: 2.0000 | INHALATION_SPRAY | Freq: Four times a day (QID) | RESPIRATORY_TRACT | 5 refills | Status: DC | PRN

## 2019-01-11 NOTE — Progress Notes (Signed)
Subjective:    Patient ID: Jack Carney, male    DOB: [birth year], 48 y.o.   MRN: [id number]  HPI  Here after recent hospn for UTI/sepsis, 47 y.o.malewith medical history significant ofnonobstructive kidney stone, hypertension, hyperlipidemia, asthma, obesity, who presents with fever, chills, left flank pain, nausea and vomited prior to admit  He did not have dysuria or burning on urination, but reports urge for urinating and pressure feeling in suprapubic area. No gross hematuria. # CT-per renal stone study on 12/08/2018: 1. Nonobstructing 2 mm calculus mid to lower pole region right kidney. No hydronephrosis or ureteral calculus on either side. Urinary bladder wall thickness within normal limits. Overall he feels better, finishing antibiotic, and Denies urinary symptoms such as dysuria, frequency, urgency, flank pain, hematuria or n/v, fever, chills.  Pt denies chest pain, increased sob or doe, wheezing, orthopnea, PND, increased LE swelling, palpitations, dizziness or syncope.  Pt denies new neurological symptoms such as new headache, or facial or extremity weakness or numbness   Pt denies polydipsia, polyuria Past Medical History:  Diagnosis Date  . ALLERGIC RHINITIS 05/08/2007   Qualifier: Diagnosis of  By: Jonny Ruiz MD, Len Blalock   . ASTHMA 05/08/2007   Qualifier: Diagnosis of  By: Jonny Ruiz MD, Len Blalock   . HYPERLIPIDEMIA 05/08/2007   Qualifier: Diagnosis of  By: Jonny Ruiz MD, Len Blalock    Past Surgical History:  Procedure Laterality Date  . sinus surgury    . TONSILLECTOMY      reports that he has never smoked. He has never used smokeless tobacco. He reports that he does not drink alcohol or use drugs. family history includes Hypertension in an other family member. Allergies  Allergen Reactions  . Sulfonamide Derivatives Nausea And Vomiting    Stomach pains  . Penicillins Rash    Did it involve swelling of the face/tongue/throat, SOB, or low BP? Y Did it involve sudden or severe rash/hives,  skin peeling, or any reaction on the inside of your mouth or nose? N Did you need to seek medical attention at a hospital or doctor's office? U When did it last happen? Childhood Allergy If all above answers are "NO", may proceed with cephalosporin use.    Current Outpatient Medications on File Prior to Visit  Medication Sig Dispense Refill  . amLODipine (NORVASC) 2.5 MG tablet Take 1 tablet (2.5 mg total) by mouth daily. 90 tablet 3  . guaiFENesin (MUCINEX) 600 MG 12 hr tablet Take 1,200 mg by mouth 2 (two) times daily.    . tamsulosin (FLOMAX) 0.4 MG CAPS capsule Take 1 capsule (0.4 mg total) by mouth daily. 30 capsule 0   No current facility-administered medications on file prior to visit.    Review of Systems  Constitutional: Negative for other unusual diaphoresis or sweats HENT: Negative for ear discharge or swelling Eyes: Negative for other worsening visual disturbances Respiratory: Negative for stridor or other swelling  Gastrointestinal: Negative for worsening distension or other blood Genitourinary: Negative for retention or other urinary change Musculoskeletal: Negative for other MSK pain or swelling Skin: Negative for color change or other new lesions Neurological: Negative for worsening tremors and other numbness  Psychiatric/Behavioral: Negative for worsening agitation or other fatigue All other system neg per pt    Objective:   Physical Exam BP (!) 146/98   Pulse 89   Temp 98.4 F (36.9 C) (Oral)   Ht 5\' 9"  (1.753 m)   Wt 249 lb (112.9 kg)   SpO2 94%  BMI 36.77 kg/m  VS noted,  Constitutional: Pt appears in NAD HENT: Head: NCAT.  Right Ear: External ear normal.  Left Ear: External ear normal.  Eyes: . Pupils are equal, round, and reactive to light. Conjunctivae and EOM are normal Nose: without d/c or deformity Neck: Neck supple. Gross normal ROM Cardiovascular: Normal rate and regular rhythm.   Pulmonary/Chest: Effort normal and breath sounds  without rales or wheezing.  Abd:  Soft, NT, ND, + BS, no organomegaly Neurological: Pt is alert. At baseline orientation, motor grossly intact Skin: Skin is warm. No rashes, other new lesions, no LE edema Psychiatric: Pt behavior is normal without agitation  No other exam findings Lab Results  Component Value Date   WBC 8.1 12/25/2018   HGB 12.3 (L) 12/25/2018   HCT 39.3 12/25/2018   PLT 154 12/25/2018   GLUCOSE 128 (H) 12/25/2018   ALT 23 12/24/2018   AST 21 12/24/2018   NA 135 12/25/2018   K 3.8 12/25/2018   CL 108 12/25/2018   CREATININE 1.77 (H) 12/25/2018   BUN 15 12/25/2018   CO2 20 (L) 12/25/2018   TSH 1.620 12/25/2018   INR 1.0 12/24/2018        Assessment & Plan:

## 2019-01-11 NOTE — Patient Instructions (Addendum)
Please continue to monitor your Blood Pressures at home, as the goal is to be less than 140/90 more often than not  Please continue all other medications as before, and refills have been done if requested.  Please have the pharmacy call with any other refills you may need.  Please continue your efforts at being more active, low cholesterol diet, and weight control.  You are otherwise up to date with prevention measures today.  Please keep your appointments with your specialists as you may have planned  You will be contacted regarding the referral for: urology  Please return in 6 months, or sooner if needed, with Lab testing done 3-5 days before

## 2019-01-13 NOTE — Assessment & Plan Note (Signed)
stable overall by history and exam, recent data reviewed with pt, and pt to continue medical treatment as before,  to f/u any worsening symptoms or concerns  

## 2019-01-13 NOTE — Assessment & Plan Note (Signed)
Mild elevated today unusual for him, susepct reactive, to continue same tx, follow BP at home and next visit

## 2019-01-13 NOTE — Assessment & Plan Note (Signed)
Resolved symptomatically, also for Urology referral to further evaluate for cause

## 2019-03-01 ENCOUNTER — Telehealth: Payer: Self-pay | Admitting: Internal Medicine

## 2019-03-01 MED ORDER — TAMSULOSIN HCL 0.4 MG PO CAPS: 0.4000 mg | ORAL_CAPSULE | Freq: Every day | ORAL | 1 refills | Status: DC

## 2019-03-01 MED ORDER — AMLODIPINE BESYLATE 2.5 MG PO TABS: 2.5000 mg | ORAL_TABLET | Freq: Every day | ORAL | 1 refills | Status: DC

## 2019-03-01 NOTE — Telephone Encounter (Signed)
Copied from CRM [id number]. Topic: Quick Communication - Rx Refill/Question >> Mar 01, 2019  2:57 PM Gwenlyn Fudge A wrote: Medication: amLODipine (NORVASC) 2.5 MG tablet & tamsulosin (FLOMAX) 0.4 MG CAPS capsule  Has the patient contacted their pharmacy? Yes.  Pt is requesting for an increase in dosage for the amlodipine. Please advise.  (Agent: If no, request that the patient contact the pharmacy for the refill.) (Agent: If yes, when and what did the pharmacy advise?)  Preferred Pharmacy (with phone number or street name): [id number] [place], [place] - [address] AT [facility] OF [facility] & [address] [place] [address] [place] [address] Phone: [phone] Fax: [phone] Not a 24 hour pharmacy; exact hours not known.    Agent: Please be advised that RX refills may take up to 3 business days. We ask that you follow-up with your pharmacy.

## 2019-04-30 ENCOUNTER — Telehealth: Payer: Self-pay

## 2019-04-30 MED ORDER — AMLODIPINE BESYLATE 2.5 MG PO TABS: 2.5000 mg | ORAL_TABLET | Freq: Every day | ORAL | 1 refills | Status: DC

## 2019-04-30 NOTE — Telephone Encounter (Signed)
Copied from [place] [id number]. Topic: Quick Communication - Rx Refill/Question >> Apr 29, 2019  5:00 PM Parke Poisson wrote: Medication: amLODipine (NORVASC) 2.5 MG tablet  Has the patient contacted their pharmacy Yes  (Agent: If yes, when and what did the pharmacy advise?) It is to early but pt states he was advised to double up on tablets.Now is completely out  Preferred Pharmacy (with phone number or street name): [facility], [place] [phone] (Phone) [phone] (Fax)    Agent: Please be advised that RX refills may take up to 3 business days. We ask that you follow-up with your pharmacy.

## 2019-05-02 MED ORDER — AMLODIPINE BESYLATE 5 MG PO TABS: 5.0000 mg | ORAL_TABLET | Freq: Every day | ORAL | 3 refills | Status: DC

## 2019-05-02 NOTE — Telephone Encounter (Signed)
Ok this is done 

## 2019-05-02 NOTE — Telephone Encounter (Signed)
Patient is calling because he is requesting a new script for Amlopine 5mg . Because he is taking the 2.5- two daily cause the prescription to run out faster. The pharmacy did received the script for the 2.5. However, they will not release it to him until the 15th. Therefore, the patient is  requesting a new script for 5mg  to last him the full 45 days.   [place]. [phone]  Thank you

## 2019-05-02 NOTE — Addendum Note (Signed)
Addended by: Biagio Borg on: 05/02/2019 12:07 PM   Modules accepted: Orders

## 2019-05-13 ENCOUNTER — Encounter: Payer: Self-pay | Admitting: Internal Medicine

## 2019-07-19 ENCOUNTER — Ambulatory Visit: Payer: Self-pay | Admitting: Internal Medicine

## 2019-07-19 DIAGNOSIS — Z0289 Encounter for other administrative examinations: Secondary | ICD-10-CM

## 2019-09-24 ENCOUNTER — Other Ambulatory Visit: Payer: Self-pay | Admitting: *Deleted

## 2019-09-24 MED ORDER — TAMSULOSIN HCL 0.4 MG PO CAPS: 0.4000 mg | ORAL_CAPSULE | Freq: Every day | ORAL | 0 refills | Status: DC

## 2019-10-16 ENCOUNTER — Other Ambulatory Visit (INDEPENDENT_AMBULATORY_CARE_PROVIDER_SITE_OTHER): Payer: BC Managed Care – PPO

## 2019-10-16 ENCOUNTER — Ambulatory Visit (INDEPENDENT_AMBULATORY_CARE_PROVIDER_SITE_OTHER): Payer: BC Managed Care – PPO | Admitting: Internal Medicine

## 2019-10-16 ENCOUNTER — Encounter: Payer: Self-pay | Admitting: Internal Medicine

## 2019-10-16 ENCOUNTER — Other Ambulatory Visit: Payer: Self-pay

## 2019-10-16 VITALS — BP 170/120 | HR 92 | Temp 98.5°F | Ht 69.0 in | Wt 261.0 lb

## 2019-10-16 DIAGNOSIS — I10 Essential (primary) hypertension: Secondary | ICD-10-CM

## 2019-10-16 DIAGNOSIS — N10 Acute tubulo-interstitial nephritis: Secondary | ICD-10-CM

## 2019-10-16 DIAGNOSIS — R739 Hyperglycemia, unspecified: Secondary | ICD-10-CM | POA: Insufficient documentation

## 2019-10-16 DIAGNOSIS — R3129 Other microscopic hematuria: Secondary | ICD-10-CM | POA: Diagnosis not present

## 2019-10-16 DIAGNOSIS — N200 Calculus of kidney: Secondary | ICD-10-CM | POA: Insufficient documentation

## 2019-10-16 DIAGNOSIS — R109 Unspecified abdominal pain: Secondary | ICD-10-CM | POA: Insufficient documentation

## 2019-10-16 DIAGNOSIS — E1165 Type 2 diabetes mellitus with hyperglycemia: Secondary | ICD-10-CM | POA: Insufficient documentation

## 2019-10-16 DIAGNOSIS — IMO0002 Reserved for concepts with insufficient information to code with codable children: Secondary | ICD-10-CM | POA: Insufficient documentation

## 2019-10-16 LAB — POC URINALSYSI DIPSTICK (AUTOMATED)
Bilirubin, UA: NEGATIVE
Blood, UA: 10
Glucose, UA: NEGATIVE
Ketones, UA: NEGATIVE
Leukocytes, UA: NEGATIVE
Nitrite, UA: NEGATIVE
Protein, UA: POSITIVE — AB
Spec Grav, UA: 1.025 (ref 1.010–1.025)
Urobilinogen, UA: 0.2 E.U./dL
pH, UA: 6 (ref 5.0–8.0)

## 2019-10-16 MED ORDER — LEVOFLOXACIN 500 MG PO TABS: 500.0000 mg | ORAL_TABLET | Freq: Every day | ORAL | 0 refills | Status: AC

## 2019-10-16 MED ORDER — TAMSULOSIN HCL 0.4 MG PO CAPS: 0.4000 mg | ORAL_CAPSULE | Freq: Every day | ORAL | 0 refills | Status: DC

## 2019-10-16 MED ORDER — INDAPAMIDE 1.25 MG PO TABS: 1.2500 mg | ORAL_TABLET | Freq: Every day | ORAL | 0 refills | Status: DC

## 2019-10-16 MED ORDER — PROMETHAZINE HCL 12.5 MG PO TABS: 12.5000 mg | ORAL_TABLET | Freq: Four times a day (QID) | ORAL | 0 refills | Status: DC | PRN

## 2019-10-16 MED ORDER — OXYCODONE HCL 5 MG PO TABS: 5.0000 mg | ORAL_TABLET | Freq: Four times a day (QID) | ORAL | 0 refills | Status: DC | PRN

## 2019-10-16 NOTE — Patient Instructions (Signed)
Flank Pain, Adult Flank pain is pain that is located on the side of the body between the upper abdomen and the back. This area is called the flank. The pain may occur over a short period of time (acute), or it may be long-term or recurring (chronic). It may be mild or severe. Flank pain can be caused by many things, including:  Muscle soreness or injury.  Kidney stones or kidney disease.  Stress.  A disease of the spine (vertebral disk disease).  A lung infection (pneumonia).  Fluid around the lungs (pulmonary edema).  A skin rash caused by the chickenpox virus (shingles).  Tumors that affect the back of the abdomen.  Gallbladder disease. Follow these instructions at home:   Drink enough fluid to keep your urine clear or pale yellow.  Rest as told by your health care provider.  Take over-the-counter and prescription medicines only as told by your health care provider.  Keep a journal to track what has caused your flank pain and what has made it feel better.  Keep all follow-up visits as told by your health care provider. This is important. Contact a health care provider if:  Your pain is not controlled with medicine.  You have new symptoms.  Your pain gets worse.  You have a fever.  Your symptoms last longer than 2-3 days.  You have trouble urinating or you are urinating very frequently. Get help right away if:  You have trouble breathing or you are short of breath.  Your abdomen hurts or it is swollen or red.  You have nausea or vomiting.  You feel faint or you pass out.  You have blood in your urine. Summary  Flank pain is pain that is located on the side of the body between the upper abdomen and the back.  The pain may occur over a short period of time (acute), or it may be long-term or recurring (chronic). It may be mild or severe.  Flank pain can be caused by many things.  Contact your health care provider if your symptoms get worse or they last  longer than 2-3 days. This information is not intended to replace advice given to you by your health care provider. Make sure you discuss any questions you have with your health care provider. Document Released: 12/01/2005 Document Revised: 09/22/2017 Document Reviewed: 12/23/2016 Elsevier Patient Education  2020 [employer]  

## 2019-10-16 NOTE — Progress Notes (Signed)
Subjective:  Patient ID: Jack Carney, male    DOB: [birth year]  Age: 48 y.o. MRN: [id number]  CC: Hypertension  This visit occurred during the SARS-CoV-2 public health emergency.  Safety protocols were in place, including screening questions prior to the visit, additional usage of staff PPE, and extensive cleaning of exam room while observing appropriate contact time as indicated for disinfecting solutions.   NEW TO ME  HPI Jack IgoDonald D Nordlund presents for a 5-day history of right lower flank pain.  He tells me that 8 months ago he was treated for a kidney stone that was complicated by pyelonephritis and sepsis.  He had one episode of nausea and vomiting earlier today.  He also states he has felt constipated for the past few days.  He complains of frequency but denies dysuria, hematuria, fever, chills, or rash.  Outpatient Medications Prior to Visit  Medication Sig Dispense Refill  . albuterol (PROVENTIL HFA;VENTOLIN HFA) 108 (90 Base) MCG/ACT inhaler Inhale 2 puffs into the lungs every 6 (six) hours as needed for wheezing or shortness of breath. 1 Inhaler 5  . amLODipine (NORVASC) 5 MG tablet Take 1 tablet (5 mg total) by mouth daily. 90 tablet 3  . guaiFENesin (MUCINEX) 600 MG 12 hr tablet Take 1,200 mg by mouth 2 (two) times daily.    . tamsulosin (FLOMAX) 0.4 MG CAPS capsule Take 1 capsule (0.4 mg total) by mouth daily. (Patient not taking: Reported on 10/16/2019) 90 capsule 0   No facility-administered medications prior to visit.    ROS Review of Systems  Constitutional: Negative for chills, diaphoresis, fatigue and fever.  HENT: Negative.   Eyes: Negative.   Respiratory: Negative for cough, chest tightness, shortness of breath and wheezing.   Cardiovascular: Negative for chest pain, palpitations and leg swelling.  Gastrointestinal: Positive for constipation, nausea and vomiting. Negative for abdominal pain and diarrhea.  Endocrine: Negative.   Genitourinary: Positive for flank  pain and frequency. Negative for decreased urine volume, difficulty urinating, discharge, dysuria, hematuria, penile swelling, scrotal swelling, testicular pain and urgency.  Musculoskeletal: Negative for arthralgias and myalgias.  Skin: Negative for color change and rash.  Neurological: Negative.  Negative for dizziness, weakness and light-headedness.  Hematological: Negative for adenopathy. Does not bruise/bleed easily.  Psychiatric/Behavioral: Negative.     Objective:  BP (!) 170/120 (BP Location: Left Arm, Patient Position: Sitting, Cuff Size: Large)   Pulse 92   Temp 98.5 F (36.9 C) (Oral)   Ht 5\' 9"  (1.753 m)   Wt 261 lb (118.4 kg)   SpO2 97%   BMI 38.54 kg/m   BP Readings from Last 3 Encounters:  10/16/19 (!) 170/120  01/11/19 (!) 146/98  12/26/18 (!) 178/89    Wt Readings from Last 3 Encounters:  10/16/19 261 lb (118.4 kg)  01/11/19 249 lb (112.9 kg)  12/24/18 258 lb 14.4 oz (117.4 kg)    Physical Exam Vitals reviewed.  Constitutional:      General: He is not in acute distress.    Appearance: Normal appearance. He is obese. He is ill-appearing (some discomfort noted). He is not toxic-appearing or diaphoretic.  HENT:     Nose: Nose normal.     Mouth/Throat:     Mouth: Mucous membranes are moist.  Eyes:     General: No scleral icterus.    Conjunctiva/sclera: Conjunctivae normal.  Cardiovascular:     Rate and Rhythm: Normal rate and regular rhythm.     Heart sounds: No murmur.  Pulmonary:  Effort: Pulmonary effort is normal.     Breath sounds: No stridor. No wheezing, rhonchi or rales.  Abdominal:     General: Abdomen is protuberant. Bowel sounds are normal. There is no distension.     Palpations: Abdomen is soft. There is no hepatomegaly or splenomegaly.     Tenderness: There is no abdominal tenderness. There is right CVA tenderness. There is no left CVA tenderness, guarding or rebound.     Hernia: No hernia is present.  Musculoskeletal:         General: Normal range of motion.     Cervical back: Neck supple.     Right lower leg: No edema.     Left lower leg: No edema.  Lymphadenopathy:     Cervical: No cervical adenopathy.  Skin:    General: Skin is warm and dry.     Findings: No rash.  Neurological:     General: No focal deficit present.     Mental Status: He is alert.  Psychiatric:        Mood and Affect: Mood normal.        Behavior: Behavior normal.     Lab Results  Component Value Date   WBC 8.1 12/25/2018   HGB 12.3 (L) 12/25/2018   HCT 39.3 12/25/2018   PLT 154 12/25/2018   GLUCOSE 128 (H) 12/25/2018   ALT 23 12/24/2018   AST 21 12/24/2018   NA 135 12/25/2018   K 3.8 12/25/2018   CL 108 12/25/2018   CREATININE 1.77 (H) 12/25/2018   BUN 15 12/25/2018   CO2 20 (L) 12/25/2018   TSH 1.620 12/25/2018   INR 1.0 12/24/2018    DG Abd Acute W/Chest  Result Date: 10/16/2019 CLINICAL DATA:  Right-sided flank pain for 1 week. Vomiting today. EXAM: DG ABDOMEN ACUTE W/ 1V CHEST COMPARISON:  CT of the abdomen and pelvis without contrast 12/08/2018 FINDINGS: The heart size is normal. Lungs are clear. Moderate stool obscures detail about the kidneys. No radiopaque stone is present. Bowel gas pattern is otherwise unremarkable. IMPRESSION: 1. No acute abnormality. 2. Moderate stool obscures detail about the kidneys. No proximal stones are visualized. 3. No radiopaque stone. Electronically Signed   By: San Morelle M.D.   On: 10/16/2019 16:01    Assessment & Plan:   Machi was seen today for hypertension.  Diagnoses and all orders for this visit:  Acute right flank pain- He has right flank pain, a history of renal stone with pyelonephritis. Urine today is positive for microscopic blood, plain films do not identify a stone.  I have asked him to undergo a renal CT to see if there is an obstructing stone.  Will empirically treat for stone with pain management and prevent infection with broad-spectrum antibiotic. -      DG Abd Acute W/Chest; Future -     Cancel: CBC with Differential -     Cancel: Amylase -     Cancel: Lipase -     POCT Urinalysis Dipstick (Automated) -     CT RENAL STONE STUDY; Future -     Urine Culture -     CBC w/Diff; Future -     Amylase; Future -     Lipase; Future  Hyperglycemia -     Cancel: Basic metabolic panel -     Cancel: Hemoglobin A1c -     Hemoglobin A1c; Future -     Basic Metabolic Panel (BMET); Future  Essential hypertension- His blood pressure is  not adequately well controlled.  I will monitor his electrolytes and renal function.  I have asked him to add indapamide to his current regimen. -     Cancel: Basic metabolic panel -     Cancel: TSH -     Cancel: Hepatic function panel -     Cancel: Vitamin D 25 hydroxy -     indapamide (LOZOL) 1.25 MG tablet; Take 1 tablet (1.25 mg total) by mouth daily. -     Hepatic function panel; Future -     Vitamin D (25 hydroxy); Future -     TSH; Future -     Basic Metabolic Panel (BMET); Future  Other microscopic hematuria -     CT RENAL STONE STUDY; Future -     Urine Culture  Acute pyelonephritis -     levofloxacin (LEVAQUIN) 500 MG tablet; Take 1 tablet (500 mg total) by mouth daily for 5 days. -     Urine Culture  Kidney stone on right side -     tamsulosin (FLOMAX) 0.4 MG CAPS capsule; Take 1 capsule (0.4 mg total) by mouth daily. -     promethazine (PHENERGAN) 12.5 MG tablet; Take 1 tablet (12.5 mg total) by mouth every 6 (six) hours as needed for nausea or vomiting. -     oxyCODONE (OXY IR/ROXICODONE) 5 MG immediate release tablet; Take 1 tablet (5 mg total) by mouth every 6 (six) hours as needed for severe pain. -     Urine Culture   I am having Jack Igo start on indapamide, levofloxacin, promethazine, and oxyCODONE. I am also having him maintain his guaiFENesin, albuterol, amLODipine, and tamsulosin.  Meds ordered this encounter  Medications  . indapamide (LOZOL) 1.25 MG tablet    Sig: Take 1  tablet (1.25 mg total) by mouth daily.    Dispense:  90 tablet    Refill:  0  . levofloxacin (LEVAQUIN) 500 MG tablet    Sig: Take 1 tablet (500 mg total) by mouth daily for 5 days.    Dispense:  5 tablet    Refill:  0  . tamsulosin (FLOMAX) 0.4 MG CAPS capsule    Sig: Take 1 capsule (0.4 mg total) by mouth daily.    Dispense:  90 capsule    Refill:  0  . promethazine (PHENERGAN) 12.5 MG tablet    Sig: Take 1 tablet (12.5 mg total) by mouth every 6 (six) hours as needed for nausea or vomiting.    Dispense:  45 tablet    Refill:  0  . oxyCODONE (OXY IR/ROXICODONE) 5 MG immediate release tablet    Sig: Take 1 tablet (5 mg total) by mouth every 6 (six) hours as needed for severe pain.    Dispense:  25 tablet    Refill:  0     Follow-up: Return in about 1 week (around 10/23/2019).  Sanda Linger, MD

## 2019-10-17 ENCOUNTER — Inpatient Hospital Stay: Admission: RE | Admit: 2019-10-17 | Payer: BC Managed Care – PPO | Source: Ambulatory Visit

## 2019-10-17 LAB — URINE CULTURE: Result:: NO GROWTH

## 2019-10-21 ENCOUNTER — Encounter: Payer: Self-pay | Admitting: Internal Medicine

## 2019-10-21 ENCOUNTER — Other Ambulatory Visit: Payer: Self-pay

## 2019-10-21 ENCOUNTER — Other Ambulatory Visit (INDEPENDENT_AMBULATORY_CARE_PROVIDER_SITE_OTHER): Payer: BC Managed Care – PPO

## 2019-10-21 ENCOUNTER — Other Ambulatory Visit: Payer: Self-pay | Admitting: Internal Medicine

## 2019-10-21 ENCOUNTER — Other Ambulatory Visit: Payer: BC Managed Care – PPO

## 2019-10-21 DIAGNOSIS — R109 Unspecified abdominal pain: Secondary | ICD-10-CM

## 2019-10-21 DIAGNOSIS — I10 Essential (primary) hypertension: Secondary | ICD-10-CM | POA: Diagnosis not present

## 2019-10-21 DIAGNOSIS — R739 Hyperglycemia, unspecified: Secondary | ICD-10-CM

## 2019-10-21 LAB — LIPASE: Lipase: 29 U/L (ref 11.0–59.0)

## 2019-10-21 LAB — CBC WITH DIFFERENTIAL/PLATELET
Basophils Absolute: 0 10*3/uL (ref 0.0–0.1)
Basophils Relative: 0.6 % (ref 0.0–3.0)
Eosinophils Absolute: 0.2 10*3/uL (ref 0.0–0.7)
Eosinophils Relative: 3 % (ref 0.0–5.0)
HCT: 42.7 % (ref 39.0–52.0)
Hemoglobin: 13.9 g/dL (ref 13.0–17.0)
Lymphocytes Relative: 24.6 % (ref 12.0–46.0)
Lymphs Abs: 1.7 10*3/uL (ref 0.7–4.0)
MCHC: 32.5 g/dL (ref 30.0–36.0)
MCV: 79.7 fl (ref 78.0–100.0)
Monocytes Absolute: 0.9 10*3/uL (ref 0.1–1.0)
Monocytes Relative: 13 % — ABNORMAL HIGH (ref 3.0–12.0)
Neutro Abs: 4 10*3/uL (ref 1.4–7.7)
Neutrophils Relative %: 58.8 % (ref 43.0–77.0)
Platelets: 254 10*3/uL (ref 150.0–400.0)
RBC: 5.36 Mil/uL (ref 4.22–5.81)
RDW: 14.9 % (ref 11.5–15.5)
WBC: 6.8 10*3/uL (ref 4.0–10.5)

## 2019-10-21 LAB — HEPATIC FUNCTION PANEL
ALT: 42 U/L (ref 0–53)
AST: 23 U/L (ref 0–37)
Albumin: 4.4 g/dL (ref 3.5–5.2)
Alkaline Phosphatase: 73 U/L (ref 39–117)
Bilirubin, Direct: 0.1 mg/dL (ref 0.0–0.3)
Total Bilirubin: 0.4 mg/dL (ref 0.2–1.2)
Total Protein: 7.5 g/dL (ref 6.0–8.3)

## 2019-10-21 LAB — BASIC METABOLIC PANEL
BUN: 24 mg/dL — ABNORMAL HIGH (ref 6–23)
CO2: 26 mEq/L (ref 19–32)
Calcium: 9.7 mg/dL (ref 8.4–10.5)
Chloride: 104 mEq/L (ref 96–112)
Creatinine, Ser: 1.98 mg/dL — ABNORMAL HIGH (ref 0.40–1.50)
GFR: 43.45 mL/min — ABNORMAL LOW (ref >60.00)
Glucose, Bld: 123 mg/dL — ABNORMAL HIGH (ref 70–99)
Potassium: 4 mEq/L (ref 3.5–5.1)
Sodium: 138 mEq/L (ref 135–145)

## 2019-10-21 LAB — TSH: TSH: 3.74 u[IU]/mL (ref 0.35–4.50)

## 2019-10-21 LAB — VITAMIN D 25 HYDROXY (VIT D DEFICIENCY, FRACTURES): VITD: 14.82 ng/mL — ABNORMAL LOW (ref 30.00–100.00)

## 2019-10-21 LAB — AMYLASE: Amylase: 43 U/L (ref 27–131)

## 2019-10-21 LAB — HEMOGLOBIN A1C: Hgb A1c MFr Bld: 7.3 % — ABNORMAL HIGH (ref 4.6–6.5)

## 2019-10-21 MED ORDER — VITAMIN D (ERGOCALCIFEROL) 1.25 MG (50000 UNIT) PO CAPS: 50000.0000 [IU] | ORAL_CAPSULE | ORAL | 0 refills | Status: DC

## 2019-10-22 ENCOUNTER — Telehealth: Payer: Self-pay | Admitting: *Deleted

## 2019-10-22 NOTE — Telephone Encounter (Signed)
Pt has been inform about lab results by Cecille Rubin.

## 2019-10-28 ENCOUNTER — Ambulatory Visit: Payer: BC Managed Care – PPO | Admitting: Internal Medicine

## 2019-12-28 ENCOUNTER — Other Ambulatory Visit: Payer: Self-pay | Admitting: Internal Medicine

## 2019-12-28 DIAGNOSIS — N200 Calculus of kidney: Secondary | ICD-10-CM

## 2019-12-28 NOTE — Telephone Encounter (Signed)
Please refill as per office routine med refill policy (all routine meds refilled for 3 mo or monthly per pt preference up to one year from last visit, then month to month grace period for 3 mo, then further med refills will have to be denied)  

## 2020-01-11 ENCOUNTER — Other Ambulatory Visit: Payer: Self-pay | Admitting: Internal Medicine

## 2020-01-11 DIAGNOSIS — I10 Essential (primary) hypertension: Secondary | ICD-10-CM

## 2020-01-17 ENCOUNTER — Other Ambulatory Visit: Payer: Self-pay | Admitting: Internal Medicine

## 2020-01-17 NOTE — Telephone Encounter (Signed)
Please ask pt to change to OTC Vitamin D3 at 2000 units per day, indefinitely.  

## 2020-02-06 ENCOUNTER — Other Ambulatory Visit: Payer: Self-pay | Admitting: Internal Medicine

## 2020-02-06 DIAGNOSIS — N200 Calculus of kidney: Secondary | ICD-10-CM

## 2020-04-03 ENCOUNTER — Other Ambulatory Visit: Payer: Self-pay | Admitting: Internal Medicine

## 2020-04-03 NOTE — Telephone Encounter (Signed)
Please refill as per office routine med refill policy (all routine meds refilled for 3 mo or monthly per pt preference up to one year from last visit, then month to month grace period for 3 mo, then further med refills will have to be denied)  

## 2020-07-22 IMAGING — CT CT RENAL STONE PROTOCOL
2 of 4 series · 16 of 46 positions shown, 18 images · non-contrast
Comparison: December 04, 2017

CLINICAL DATA: Left flank/abdominal pain

EXAM:
CT ABDOMEN AND PELVIS WITHOUT CONTRAST
TECHNIQUE: Multidetector CT imaging of the abdomen and pelvis was performed
following the standard protocol without oral or IV contrast.

[Series 2: axial st · axial · 0.96mm/px · z∈[+1138,+1578]mm · 13 of 100 slices shown, 15 images]
[im 6/100  soft-tissue]
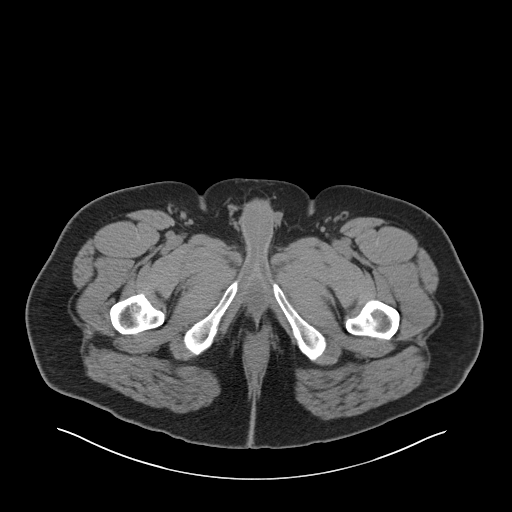
[im 6/100  bone]
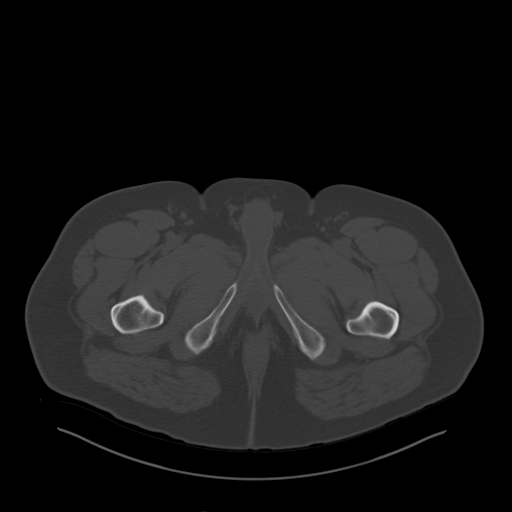
[im 16/100  soft-tissue]
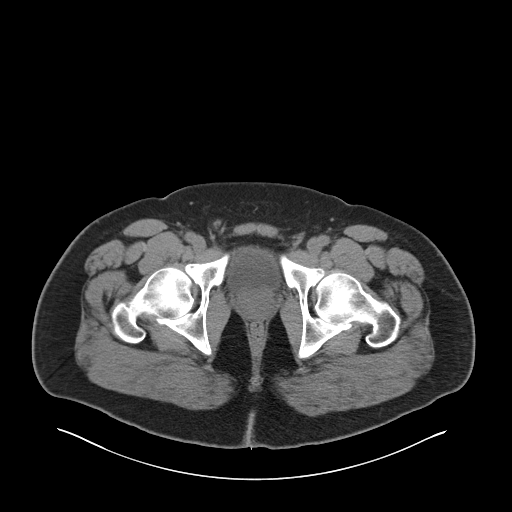
[im 21/100  soft-tissue]
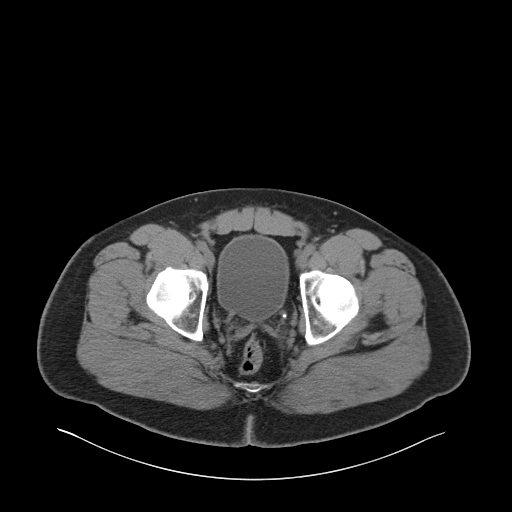
[im 27/100  soft-tissue]
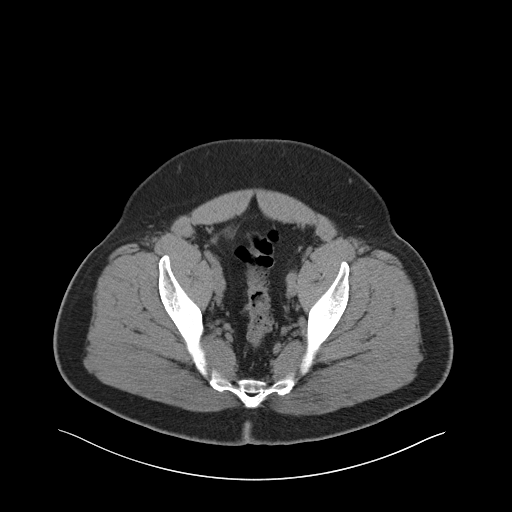
[im 37/100  soft-tissue]
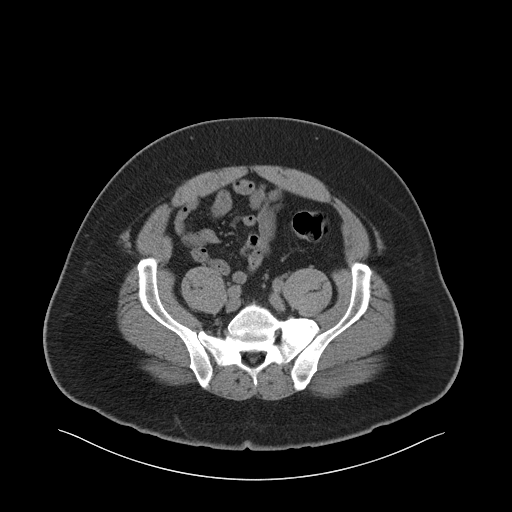
[im 42/100  soft-tissue]
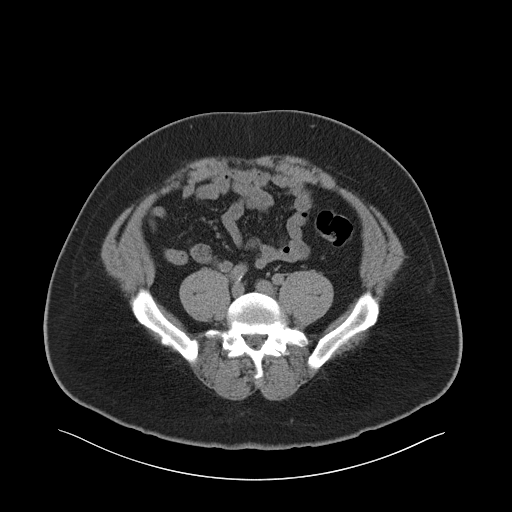
[im 53/100  soft-tissue]
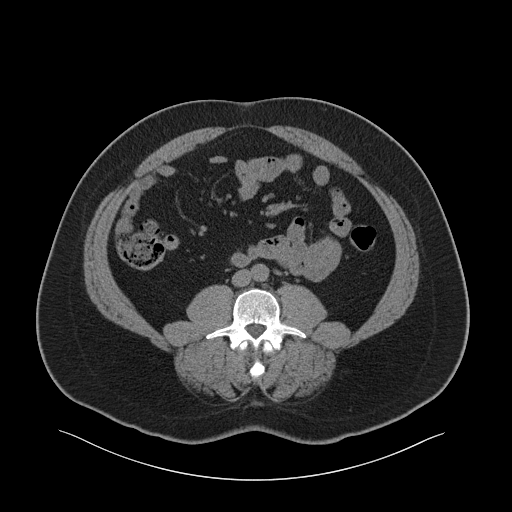
[im 58/100  soft-tissue]
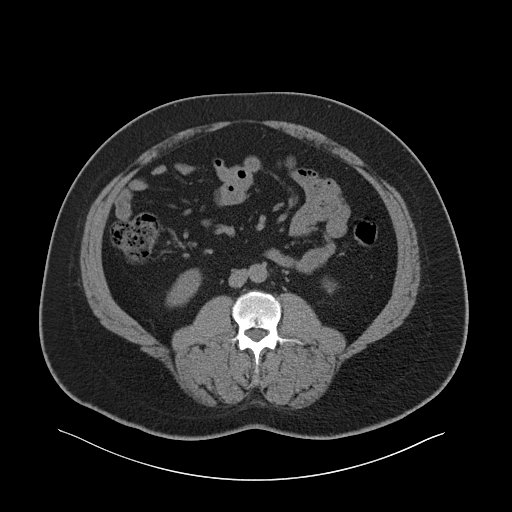
[im 63/100  soft-tissue]
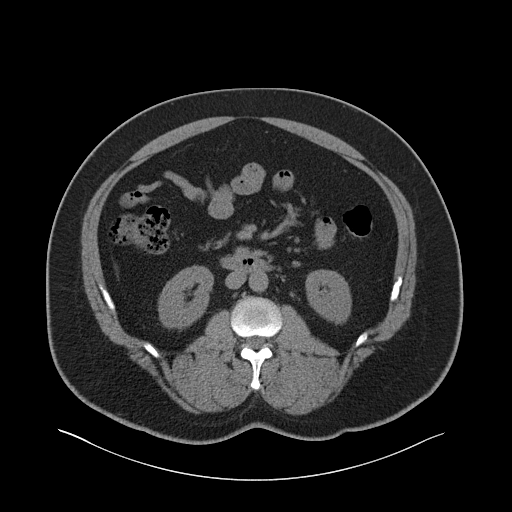
[im 63/100  bone]
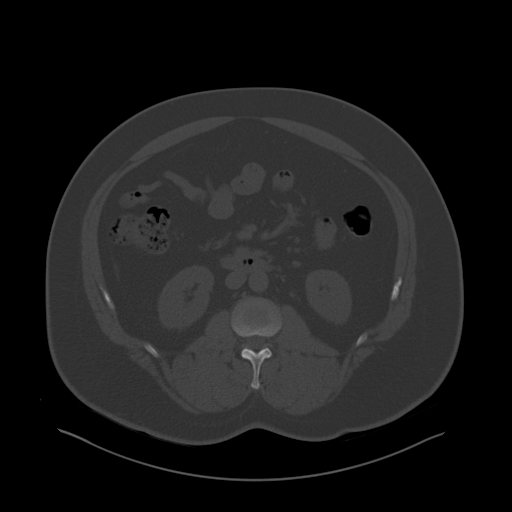
[im 73/100  soft-tissue]
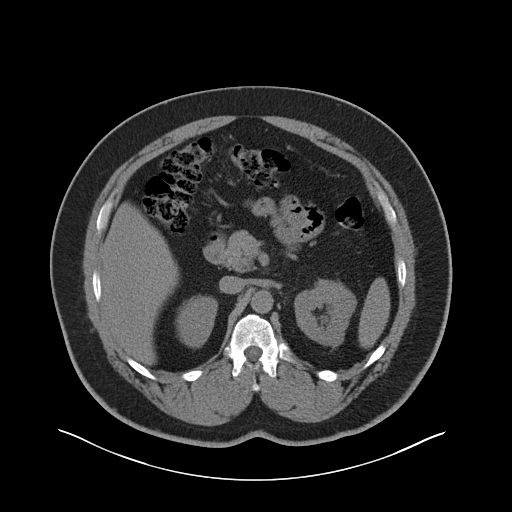
[im 79/100  soft-tissue]
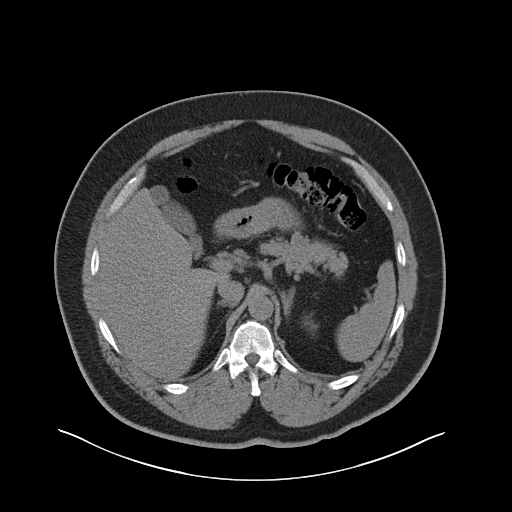
[im 84/100  soft-tissue]
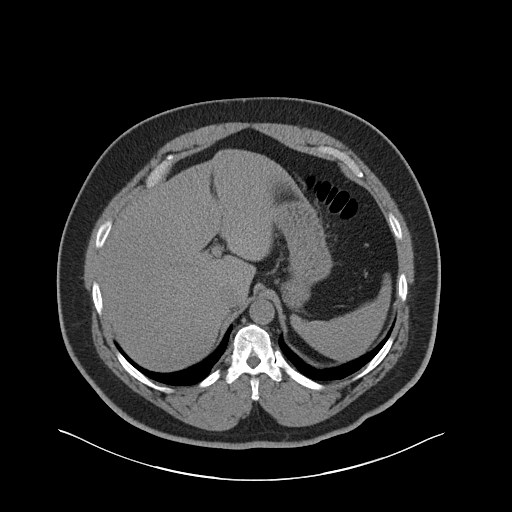
[im 94/100  soft-tissue]
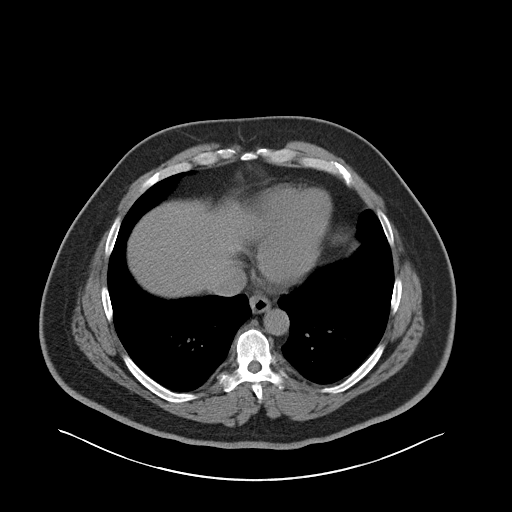

[Series 4: coronal · coronal · 0.99mm/px · 3 of 199 slices shown]
[im 67/199  soft-tissue]
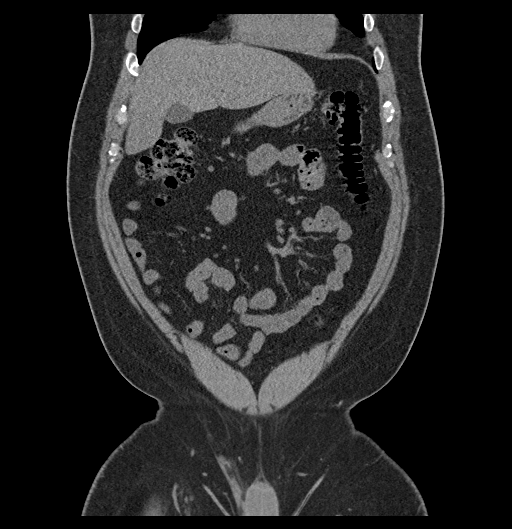
[im 89/199  soft-tissue]
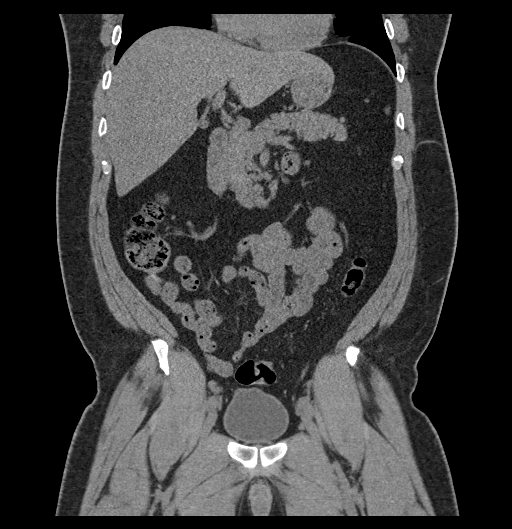
[im 111/199  soft-tissue]
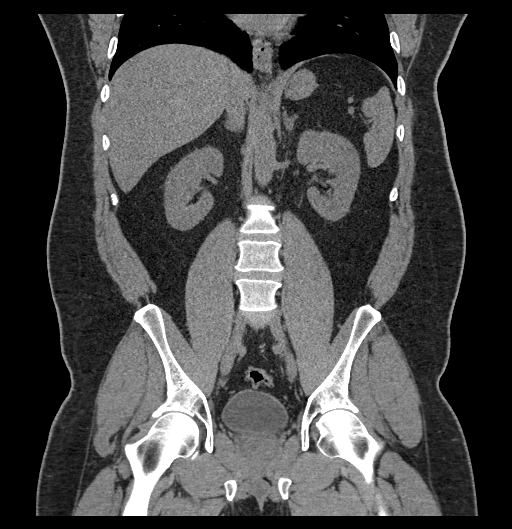

[16 of 46 positions shown; findings below may reference images not displayed]

FINDINGS: Lower chest: There is mild bibasilar atelectatic change. No lung
base edema or consolidation evident.

Hepatobiliary: No liver lesions are appreciable on this noncontrast
enhanced study. Gallbladder wall is not appreciably thickened. There
is no biliary duct dilatation.

Pancreas: No pancreatic mass or inflammatory focus.

Spleen: No splenic lesions are evident.

Adrenals/Urinary Tract: Adrenals bilaterally appear unremarkable.
Kidneys bilaterally show no evident mass or hydronephrosis on either
side. There is a 2 mm calculus in the mid to lower right kidney.
There is no evident ureteral calculus on either side. The urinary
bladder is midline with wall thickness within normal limits.

Stomach/Bowel: There is no appreciable bowel wall or mesenteric
thickening. There is no evident bowel obstruction. There is no free
air or portal venous air.

Vascular/Lymphatic: No abdominal aortic aneurysm. No evident
vascular lesions on this noncontrast enhanced study. There is no
evident adenopathy in the abdomen or pelvis.

Reproductive: Prostate and seminal vesicles appear normal in size
and contour. There is no evident pelvic mass.

Other: The appendix appears unremarkable. There is no abscess or
ascites in the abdomen or pelvis. There is a rather minimal ventral
hernia containing only fat.

Musculoskeletal: There are no blastic or lytic bone lesions. There
is degenerative change at a left-sided L5-S1 assimilation joint. No
intramuscular lesions are evident.
IMPRESSION: 1. Nonobstructing 2 mm calculus mid to lower pole region right
kidney. No hydronephrosis or ureteral calculus on either side.
Urinary bladder wall thickness within normal limits.

2. No bowel obstruction. No abscess in the abdomen or pelvis.
Appendix appears normal.

3.  Rather minimal ventral hernia containing only fat

## 2020-12-04 ENCOUNTER — Telehealth: Payer: Self-pay | Admitting: Internal Medicine

## 2020-12-04 ENCOUNTER — Other Ambulatory Visit: Payer: Self-pay

## 2020-12-04 NOTE — Telephone Encounter (Signed)
amlodipine refill request denied needs office visit notified patient

## 2020-12-04 NOTE — Telephone Encounter (Signed)
1.Medication Requested: amLODipine (NORVASC) 5 MG tablet    2. Pharmacy (Name, Street, [place]): [facility] [phone] - [place], [place] - [address] AT [facility] OF [address]  3. On Med List: yes   4. Last Visit with PCP: 3.20.20  5. Next visit date with PCP: 2.15.22  Patient was wondering if there was any way that he could get a short supply of the medication. He said that his vision is a little blurry.    Agent: Please be advised that RX refills may take up to 3 business days. We ask that you follow-up with your pharmacy.

## 2020-12-08 ENCOUNTER — Other Ambulatory Visit (INDEPENDENT_AMBULATORY_CARE_PROVIDER_SITE_OTHER): Payer: BC Managed Care – PPO

## 2020-12-08 ENCOUNTER — Encounter: Payer: Self-pay | Admitting: Internal Medicine

## 2020-12-08 ENCOUNTER — Other Ambulatory Visit: Payer: Self-pay

## 2020-12-08 ENCOUNTER — Ambulatory Visit (INDEPENDENT_AMBULATORY_CARE_PROVIDER_SITE_OTHER): Payer: BC Managed Care – PPO | Admitting: Internal Medicine

## 2020-12-08 VITALS — BP 142/90 | HR 92 | Temp 97.8°F | Ht 69.0 in | Wt 228.0 lb

## 2020-12-08 DIAGNOSIS — Z125 Encounter for screening for malignant neoplasm of prostate: Secondary | ICD-10-CM

## 2020-12-08 DIAGNOSIS — E538 Deficiency of other specified B group vitamins: Secondary | ICD-10-CM | POA: Diagnosis not present

## 2020-12-08 DIAGNOSIS — I10 Essential (primary) hypertension: Secondary | ICD-10-CM | POA: Diagnosis not present

## 2020-12-08 DIAGNOSIS — E7849 Other hyperlipidemia: Secondary | ICD-10-CM

## 2020-12-08 DIAGNOSIS — R739 Hyperglycemia, unspecified: Secondary | ICD-10-CM

## 2020-12-08 DIAGNOSIS — N1832 Chronic kidney disease, stage 3b: Secondary | ICD-10-CM

## 2020-12-08 DIAGNOSIS — Z0001 Encounter for general adult medical examination with abnormal findings: Secondary | ICD-10-CM

## 2020-12-08 DIAGNOSIS — N200 Calculus of kidney: Secondary | ICD-10-CM

## 2020-12-08 DIAGNOSIS — E559 Vitamin D deficiency, unspecified: Secondary | ICD-10-CM

## 2020-12-08 DIAGNOSIS — N183 Chronic kidney disease, stage 3 unspecified: Secondary | ICD-10-CM | POA: Insufficient documentation

## 2020-12-08 MED ORDER — AMLODIPINE BESYLATE 5 MG PO TABS
ORAL_TABLET | ORAL | 3 refills | Status: DC
Start: 2020-12-08 — End: 2022-03-02

## 2020-12-08 MED ORDER — TAMSULOSIN HCL 0.4 MG PO CAPS: 0.4000 mg | ORAL_CAPSULE | Freq: Every day | ORAL | 3 refills | Status: DC

## 2020-12-08 MED ORDER — INDAPAMIDE 1.25 MG PO TABS: 1.2500 mg | ORAL_TABLET | Freq: Every day | ORAL | 3 refills | Status: DC

## 2020-12-08 NOTE — Patient Instructions (Signed)
Please take OTC Vitamin D3 at 2000 units per day, indefinitely.  Please continue all other medications as before, and refills have been done if requested.  Please have the pharmacy call with any other refills you may need.  Please continue your efforts at being more active, low cholesterol diet, and weight control.  You are otherwise up to date with prevention measures today.  Please keep your appointments with your specialists as you may have planned  You will be contacted regarding the referral for: renal (kidney doctor0  Please go to the LAB at the blood drawing area for the tests to be done - at the [facility] lab  You will be contacted by phone if any changes need to be made immediately.  Otherwise, you will receive a letter about your results with an explanation, but please check with MyChart first.  Please remember to sign up for MyChart if you have not done so, as this will be important to you in the future with finding out test results, communicating by private email, and scheduling acute appointments online when needed.  Please make an Appointment to return in 3 months, or sooner if needed

## 2020-12-08 NOTE — Progress Notes (Signed)
Patient ID: Jack Carney, male   DOB: [birth year], 50 y.o.   MRN: [id number]         Chief Complaint:: wellness exam and Follow-up  low vit d, ckd, hyperglyemia, htn       HPI:  Jack Carney is a 50 y.o. male here for wellness exam, declines hep c screen, tdap, flu and colonoscopy   Pt denies chest pain, increased sob or doe, wheezing, orthopnea, PND, increased LE swelling, palpitations, dizziness or syncope.  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  Has been out of BP meds, but willing to restart.  Not taking Vit d.  Denies worsening depressive symptoms, suicidal ideation, or panic;  Pt denies fever, wt loss, night sweats, loss of appetite, or other constitutional symptoms  No new complaints.     Wt Readings from Last 3 Encounters:  12/08/20 228 lb (103.4 kg)  10/16/19 261 lb (118.4 kg)  01/11/19 249 lb (112.9 kg)   BP Readings from Last 3 Encounters:  12/08/20 (!) 142/90  10/16/19 (!) 170/120  01/11/19 (!) 146/98   There is no immunization history on file for this patient. There are no preventive care reminders to display for this patient.   Past Medical History:  Diagnosis Date   ALLERGIC RHINITIS 05/08/2007   Qualifier: Diagnosis of  By: Jonny Ruiz MD, Len Blalock    ASTHMA 05/08/2007   Qualifier: Diagnosis of  By: Jonny Ruiz MD, Len Blalock    HYPERLIPIDEMIA 05/08/2007   Qualifier: Diagnosis of  By: Jonny Ruiz MD, Len Blalock    Past Surgical History:  Procedure Laterality Date   sinus surgury     TONSILLECTOMY      reports that he has never smoked. He has never used smokeless tobacco. He reports that he does not drink alcohol and does not use drugs. family history includes Hypertension in an other family member. Allergies  Allergen Reactions   Sulfonamide Derivatives Nausea And Vomiting    Stomach pains   Penicillins Rash    Did it involve swelling of the  face/tongue/throat, SOB, or low BP? Y Did it involve sudden or severe rash/hives, skin peeling, or any reaction on the inside of your mouth or nose? N Did you need to seek medical attention at a hospital or doctor's office? U When did it last happen? Childhood Allergy If all above answers are NO, may proceed with cephalosporin use.    Current Outpatient Medications on File Prior to Visit  Medication Sig Dispense Refill   albuterol (PROVENTIL HFA;VENTOLIN HFA) 108 (90 Base) MCG/ACT inhaler Inhale 2 puffs into the lungs every 6 (six) hours as needed for wheezing or shortness of breath. 1 Inhaler 5   No current facility-administered medications on file prior to visit.        ROS:  All others reviewed and negative.  Objective        PE:  BP (!) 142/90    Pulse 92    Temp 97.8 F (36.6 C) (Oral)    Ht 5\' 9"  (1.753 m)    Wt 228 lb (103.4 kg)    SpO2 97%    BMI 33.67 kg/m                 Constitutional: Pt appears in NAD               HENT: Head: NCAT.  Right Ear: External ear normal.                 Left Ear: External ear normal.                Eyes: . Pupils are equal, round, and reactive to light. Conjunctivae and EOM are normal               Nose: without d/c or deformity               Neck: Neck supple. Gross normal ROM               Cardiovascular: Normal rate and regular rhythm.                 Pulmonary/Chest: Effort normal and breath sounds without rales or wheezing.                Abd:  Soft, NT, ND, + BS, no organomegaly               Neurological: Pt is alert. At baseline orientation, motor grossly intact               Skin: Skin is warm. No rashes, no other new lesions, LE edema - none               Psychiatric: Pt behavior is normal without agitation   Micro: none  Cardiac tracings I have personally interpreted today:  none  Pertinent Radiological findings (summarize): none   Lab Results  Component Value Date   WBC 6.3 12/08/2020   HGB 14.3  12/08/2020   HCT 43.2 12/08/2020   PLT 276.0 12/08/2020   GLUCOSE 251 (H) 12/08/2020   CHOL 267 (H) 12/08/2020   TRIG 299.0 (H) 12/08/2020   HDL 48.20 12/08/2020   LDLDIRECT 177.0 12/08/2020   ALT 24 12/08/2020   AST 17 12/08/2020   NA 139 12/08/2020   K 3.7 12/08/2020   CL 105 12/08/2020   CREATININE 1.26 12/08/2020   BUN 15 12/08/2020   CO2 24 12/08/2020   TSH 3.51 12/08/2020   PSA 2.42 12/08/2020   INR 1.0 12/24/2018   HGBA1C 13.7 (H) 12/08/2020   Assessment/Plan:  Jack Carney is a 50 y.o. Black or African American [2] male with  has a past medical history of ALLERGIC RHINITIS (05/08/2007), ASTHMA (05/08/2007), and HYPERLIPIDEMIA (05/08/2007).  Encounter for well adult exam with abnormal findings Age and sex appropriate education and counseling updated with regular exercise and diet Referrals for preventative services - declines hep c scren, colonoscopy Immunizations addressed - declines tdap, and flu shot Smoking counseling  - none needed Evidence for depression or other mood disorder - none significant Most recent labs reviewed. I have personally reviewed and have noted: 1) the patient's medical and social history 2) The patient's current medications and supplements 3) The patient's height, weight, and BMI have been recorded in the chart   CKD (chronic kidney disease) stage 3, GFR 30-59 ml/min (HCC) Lab Results  Component Value Date   CREATININE 1.26 12/08/2020   Stable overall, cont to avoid nephrotoxins, also for renal referral , needs better BP control to avoid further worsening  Essential hypertension BP Readings from Last 3 Encounters:  12/08/20 (!) 142/90  10/16/19 (!) 170/120  01/11/19 (!) 146/98   Stable, pt to restart medical treatment - norvasc, lozol; encourage pt with importance of compliance   Hyperglycemia Lab Results  Component Value Date   HGBA1C 13.7 (H) 12/08/2020  Stable, pt to continue current medical treatment  -  diet   Hyperlipidemia Goal ldl < 70; for lipids with lab;  Stable, pt to continue current low chol diet, consider add statin  Vitamin D deficiency Last vitamin D Lab Results  Component Value Date   VD25OH 33.31 12/08/2020   Stable, cont oral replacement  Followup: Return in about 3 months (around 03/07/2021).  Oliver Barre, MD 12/13/2020 7:49 PM [facility]

## 2020-12-09 LAB — CBC WITH DIFFERENTIAL/PLATELET
Basophils Absolute: 0.1 10*3/uL (ref 0.0–0.1)
Basophils Relative: 1.2 % (ref 0.0–3.0)
Eosinophils Absolute: 0.1 10*3/uL (ref 0.0–0.7)
Eosinophils Relative: 1 % (ref 0.0–5.0)
HCT: 43.2 % (ref 39.0–52.0)
Hemoglobin: 14.3 g/dL (ref 13.0–17.0)
Lymphocytes Relative: 28.6 % (ref 12.0–46.0)
Lymphs Abs: 1.8 10*3/uL (ref 0.7–4.0)
MCHC: 33.1 g/dL (ref 30.0–36.0)
MCV: 78.1 fl (ref 78.0–100.0)
Monocytes Absolute: 0.8 10*3/uL (ref 0.1–1.0)
Monocytes Relative: 13.4 % — ABNORMAL HIGH (ref 3.0–12.0)
Neutro Abs: 3.5 10*3/uL (ref 1.4–7.7)
Neutrophils Relative %: 55.8 % (ref 43.0–77.0)
Platelets: 276 10*3/uL (ref 150.0–400.0)
RBC: 5.53 Mil/uL (ref 4.22–5.81)
RDW: 13.8 % (ref 11.5–15.5)
WBC: 6.3 10*3/uL (ref 4.0–10.5)

## 2020-12-09 LAB — LIPID PANEL
Cholesterol: 267 mg/dL — ABNORMAL HIGH (ref 0–200)
HDL: 48.2 mg/dL (ref >39.00)
NonHDL: 218.91
Total CHOL/HDL Ratio: 6
Triglycerides: 299 mg/dL — ABNORMAL HIGH (ref 0.0–149.0)
VLDL: 59.8 mg/dL — ABNORMAL HIGH (ref 0.0–40.0)

## 2020-12-09 LAB — HEPATIC FUNCTION PANEL
ALT: 24 U/L (ref 0–53)
AST: 17 U/L (ref 0–37)
Albumin: 4.4 g/dL (ref 3.5–5.2)
Alkaline Phosphatase: 78 U/L (ref 39–117)
Bilirubin, Direct: 0.1 mg/dL (ref 0.0–0.3)
Total Bilirubin: 0.4 mg/dL (ref 0.2–1.2)
Total Protein: 7.3 g/dL (ref 6.0–8.3)

## 2020-12-09 LAB — BASIC METABOLIC PANEL
BUN: 15 mg/dL (ref 6–23)
CO2: 24 mEq/L (ref 19–32)
Calcium: 9.8 mg/dL (ref 8.4–10.5)
Chloride: 105 mEq/L (ref 96–112)
Creatinine, Ser: 1.26 mg/dL (ref 0.40–1.50)
GFR: 66.05 mL/min (ref >60.00)
Glucose, Bld: 251 mg/dL — ABNORMAL HIGH (ref 70–99)
Potassium: 3.7 mEq/L (ref 3.5–5.1)
Sodium: 139 mEq/L (ref 135–145)

## 2020-12-09 LAB — LDL CHOLESTEROL, DIRECT: Direct LDL: 177 mg/dL

## 2020-12-09 LAB — HEMOGLOBIN A1C: Hgb A1c MFr Bld: 13.7 % — ABNORMAL HIGH (ref 4.6–6.5)

## 2020-12-09 LAB — PHOSPHORUS: Phosphorus: 3.2 mg/dL (ref 2.3–4.6)

## 2020-12-09 LAB — TSH: TSH: 3.51 u[IU]/mL (ref 0.35–4.50)

## 2020-12-09 LAB — VITAMIN B12: Vitamin B-12: >1506 pg/mL — ABNORMAL HIGH (ref 211–911)

## 2020-12-09 LAB — PSA: PSA: 2.42 ng/mL (ref 0.10–4.00)

## 2020-12-09 LAB — VITAMIN D 25 HYDROXY (VIT D DEFICIENCY, FRACTURES): VITD: 33.31 ng/mL (ref 30.00–100.00)

## 2020-12-10 LAB — PTH, INTACT AND CALCIUM
Calcium: 9.5 mg/dL (ref 8.6–10.3)
PTH: 58 pg/mL (ref 14–64)

## 2020-12-13 ENCOUNTER — Encounter: Payer: Self-pay | Admitting: Internal Medicine

## 2020-12-13 DIAGNOSIS — E559 Vitamin D deficiency, unspecified: Secondary | ICD-10-CM | POA: Insufficient documentation

## 2020-12-13 NOTE — Assessment & Plan Note (Signed)
Lab Results  Component Value Date   HGBA1C 13.7 (H) 12/08/2020   Stable, pt to continue current medical treatment  - diet

## 2020-12-13 NOTE — Assessment & Plan Note (Addendum)
BP Readings from Last 3 Encounters:  12/08/20 (!) 142/90  10/16/19 (!) 170/120  01/11/19 (!) 146/98   Stable, pt to restart medical treatment - norvasc, lozol; encourage pt with importance of compliance

## 2020-12-13 NOTE — Assessment & Plan Note (Addendum)
Lab Results  Component Value Date   CREATININE 1.26 12/08/2020   Stable overall, cont to avoid nephrotoxins, also for renal referral , needs better BP control to avoid further worsening

## 2020-12-13 NOTE — Assessment & Plan Note (Signed)
Goal ldl < 70; for lipids with lab;  Stable, pt to continue current low chol diet, consider add statin

## 2020-12-13 NOTE — Assessment & Plan Note (Signed)
Last vitamin D Lab Results  Component Value Date   VD25OH 33.31 12/08/2020   Stable, cont oral replacement

## 2020-12-13 NOTE — Assessment & Plan Note (Signed)
Age and sex appropriate education and counseling updated with regular exercise and diet Referrals for preventative services - declines hep c scren, colonoscopy Immunizations addressed - declines tdap, and flu shot Smoking counseling  - none needed Evidence for depression or other mood disorder - none significant Most recent labs reviewed. I have personally reviewed and have noted: 1) the patient's medical and social history 2) The patient's current medications and supplements 3) The patient's height, weight, and BMI have been recorded in the chart

## 2020-12-16 ENCOUNTER — Other Ambulatory Visit: Payer: Self-pay | Admitting: Internal Medicine

## 2020-12-16 ENCOUNTER — Encounter: Payer: Self-pay | Admitting: Internal Medicine

## 2020-12-16 DIAGNOSIS — E1165 Type 2 diabetes mellitus with hyperglycemia: Secondary | ICD-10-CM

## 2020-12-16 MED ORDER — GLIPIZIDE ER 10 MG PO TB24: 10.0000 mg | ORAL_TABLET | Freq: Every day | ORAL | 3 refills | Status: DC

## 2020-12-16 MED ORDER — ATORVASTATIN CALCIUM 40 MG PO TABS: 40.0000 mg | ORAL_TABLET | Freq: Every day | ORAL | 3 refills | Status: DC

## 2020-12-16 MED ORDER — METFORMIN HCL ER 500 MG PO TB24: 1000.0000 mg | ORAL_TABLET | Freq: Every day | ORAL | 3 refills | Status: DC

## 2021-02-10 ENCOUNTER — Encounter: Payer: Self-pay | Admitting: Internal Medicine

## 2021-03-17 ENCOUNTER — Other Ambulatory Visit: Payer: Self-pay

## 2021-03-17 ENCOUNTER — Ambulatory Visit: Payer: BC Managed Care – PPO | Admitting: Internal Medicine

## 2021-03-17 ENCOUNTER — Encounter: Payer: Self-pay | Admitting: Internal Medicine

## 2021-03-17 VITALS — BP 150/90 | HR 96 | Temp 98.9°F | Ht 69.0 in | Wt 240.0 lb

## 2021-03-17 DIAGNOSIS — E559 Vitamin D deficiency, unspecified: Secondary | ICD-10-CM

## 2021-03-17 DIAGNOSIS — E1165 Type 2 diabetes mellitus with hyperglycemia: Secondary | ICD-10-CM

## 2021-03-17 DIAGNOSIS — I10 Essential (primary) hypertension: Secondary | ICD-10-CM | POA: Diagnosis not present

## 2021-03-17 DIAGNOSIS — E119 Type 2 diabetes mellitus without complications: Secondary | ICD-10-CM | POA: Insufficient documentation

## 2021-03-17 NOTE — Progress Notes (Signed)
Patient ID: Jack Carney, male   DOB: 01/12/1971, 50 y.o.   MRN: [id number]        Chief Complaint: follow up HTN, HLD and hyperglycemia, anxiety, low vit d       HPI:  Jack Carney is a 50 y.o. male here with above, Pt denies chest pain, increased sob or doe, wheezing, orthopnea, PND, increased LE swelling, palpitations, dizziness or syncope.   Pt denies polydipsia, polyuria, or new focal neuro s/s.   Pt denies fever, wt loss, night sweats, loss of appetite, or other constitutional symptoms     More stressors recently.  Not taking Vit d/  BP has been < 140/90 at home.  Denies worsening depressive symptoms, suicidal ideation, or panic; has ongoing anxiety.  Difficult to get wt loss and maintain .     Wt Readings from Last 3 Encounters:  03/17/21 240 lb (108.9 kg)  12/08/20 228 lb (103.4 kg)  10/16/19 261 lb (118.4 kg)   BP Readings from Last 3 Encounters:  03/17/21 (!) 150/90  12/08/20 (!) 142/90  10/16/19 (!) 170/120         Past Medical History:  Diagnosis Date  . ALLERGIC RHINITIS 05/08/2007   Qualifier: Diagnosis of  By: Jonny Ruiz MD, Len Blalock   . ASTHMA 05/08/2007   Qualifier: Diagnosis of  By: Jonny Ruiz MD, Len Blalock   . HYPERLIPIDEMIA 05/08/2007   Qualifier: Diagnosis of  By: Jonny Ruiz MD, Len Blalock    Past Surgical History:  Procedure Laterality Date  . sinus surgury    . TONSILLECTOMY      reports that he has never smoked. He has never used smokeless tobacco. He reports that he does not drink alcohol and does not use drugs. family history includes Hypertension in an other family member. Allergies  Allergen Reactions  . Sulfonamide Derivatives Nausea And Vomiting    Stomach pains  . Penicillins Rash    Did it involve swelling of the face/tongue/throat, SOB, or low BP? Y Did it involve sudden or severe rash/hives, skin peeling, or any reaction on the inside of your mouth or nose? N Did you need to seek medical attention at a hospital or doctor's office? U When did it last happen?  Childhood Allergy If all above answers are "NO", may proceed with cephalosporin use.    Current Outpatient Medications on File Prior to Visit  Medication Sig Dispense Refill  . albuterol (PROVENTIL HFA;VENTOLIN HFA) 108 (90 Base) MCG/ACT inhaler Inhale 2 puffs into the lungs every 6 (six) hours as needed for wheezing or shortness of breath. 1 Inhaler 5  . amLODipine (NORVASC) 5 MG tablet TAKE 1 TABLET(5 MG) BY MOUTH DAILY 90 tablet 3  . glipiZIDE (GLUCOTROL XL) 10 MG 24 hr tablet Take 1 tablet (10 mg total) by mouth daily with breakfast. 90 tablet 3  . indapamide (LOZOL) 1.25 MG tablet Take 1 tablet (1.25 mg total) by mouth daily. 90 tablet 3  . tamsulosin (FLOMAX) 0.4 MG CAPS capsule Take 1 capsule (0.4 mg total) by mouth daily. Overdue for annual appt w/labs must see provider for future refills 90 capsule 3  . atorvastatin (LIPITOR) 40 MG tablet Take 1 tablet (40 mg total) by mouth daily. (Patient not taking: Reported on 03/17/2021) 90 tablet 3  . metFORMIN (GLUCOPHAGE-XR) 500 MG 24 hr tablet Take 2 tablets (1,000 mg total) by mouth daily with breakfast. (Patient not taking: Reported on 03/17/2021) 180 tablet 3   No current facility-administered medications on file prior to  visit.        ROS:  All others reviewed and negative.  Objective        PE:  BP (!) 150/90 (BP Location: Left Arm, Patient Position: Sitting, Cuff Size: Large)   Pulse 96   Temp 98.9 F (37.2 C) (Oral)   Ht 5\' 9"  (1.753 m)   Wt 240 lb (108.9 kg)   SpO2 99%   BMI 35.44 kg/m                 Constitutional: Pt appears in NAD               HENT: Head: NCAT.                Right Ear: External ear normal.                 Left Ear: External ear normal.                Eyes: . Pupils are equal, round, and reactive to light. Conjunctivae and EOM are normal               Nose: without d/c or deformity               Neck: Neck supple. Gross normal ROM               Cardiovascular: Normal rate and regular rhythm.                  Pulmonary/Chest: Effort normal and breath sounds without rales or wheezing.                Abd:  Soft, NT, ND, + BS, no organomegaly               Neurological: Pt is alert. At baseline orientation, motor grossly intact               Skin: Skin is warm. No rashes, no other new lesions, LE edema - none               Psychiatric: Pt behavior is normal without agitation   Micro: none  Cardiac tracings I have personally interpreted today:  none  Pertinent Radiological findings (summarize): none   Lab Results  Component Value Date   WBC 6.3 12/08/2020   HGB 14.3 12/08/2020   HCT 43.2 12/08/2020   PLT 276.0 12/08/2020   GLUCOSE 251 (H) 12/08/2020   CHOL 267 (H) 12/08/2020   TRIG 299.0 (H) 12/08/2020   HDL 48.20 12/08/2020   LDLDIRECT 177.0 12/08/2020   ALT 24 12/08/2020   AST 17 12/08/2020   NA 139 12/08/2020   K 3.7 12/08/2020   CL 105 12/08/2020   CREATININE 1.26 12/08/2020   BUN 15 12/08/2020   CO2 24 12/08/2020   TSH 3.51 12/08/2020   PSA 2.42 12/08/2020   INR 1.0 12/24/2018   HGBA1C 13.7 (H) 12/08/2020   Assessment/Plan:  Jack Carney is a 50 y.o. Black or African American [2] male with  has a past medical history of ALLERGIC RHINITIS (05/08/2007), ASTHMA (05/08/2007), and HYPERLIPIDEMIA (05/08/2007).  Essential hypertension Pt states BP controlled at home,  BP Readings from Last 3 Encounters:  03/17/21 (!) 150/90  12/08/20 (!) 142/90  10/16/19 (!) 170/120   Stable, pt to continue medical treatment norvasc, lozol   Diabetes type 2, uncontrolled (HCC) Lab Results  Component Value Date   HGBA1C 13.7 (H) 12/08/2020   Severe uncontrolled, pt to continue  current new recent start medical treatment glucotrol, glipizide, and for f/u a1c   Vitamin D deficiency Last vitamin D Lab Results  Component Value Date   VD25OH 33.31 12/08/2020   Stable, cont oral replacement   Followup: Return in about 6 months (around 09/17/2021).  Oliver Barre, MD 03/17/2021 9:25  PM [facility] Internal Medicine

## 2021-03-17 NOTE — Assessment & Plan Note (Addendum)
Lab Results  Component Value Date   HGBA1C 13.7 (H) 12/08/2020   Severe uncontrolled, pt to continue current new recent start medical treatment glucotrol, glipizide, and for f/u a1c

## 2021-03-17 NOTE — Assessment & Plan Note (Signed)
Pt states BP controlled at home,  BP Readings from Last 3 Encounters:  03/17/21 (!) 150/90  12/08/20 (!) 142/90  10/16/19 (!) 170/120   Stable, pt to continue medical treatment norvasc, lozol

## 2021-03-17 NOTE — Assessment & Plan Note (Signed)
Last vitamin D Lab Results  Component Value Date   VD25OH 33.31 12/08/2020   Stable, cont oral replacement

## 2021-03-17 NOTE — Patient Instructions (Signed)
Please continue all other medications as before, and refills have been done if requested. ° °Please have the pharmacy call with any other refills you may need. ° °Please continue your efforts at being more active, low cholesterol diet, and weight control. ° °You are otherwise up to date with prevention measures today. ° °Please keep your appointments with your specialists as you may have planned ° °Please go to the LAB at the blood drawing area for the tests to be done ° °You will be contacted by phone if any changes need to be made immediately.  Otherwise, you will receive a letter about your results with an explanation, but please check with MyChart first. ° °Please remember to sign up for MyChart if you have not done so, as this will be important to you in the future with finding out test results, communicating by private email, and scheduling acute appointments online when needed. ° °Please make an Appointment to return in 6 months, or sooner if needed, °

## 2021-03-18 LAB — HEPATIC FUNCTION PANEL
ALT: 24 U/L (ref 0–53)
AST: 12 U/L (ref 0–37)
Albumin: 4.1 g/dL (ref 3.5–5.2)
Alkaline Phosphatase: 70 U/L (ref 39–117)
Bilirubin, Direct: 0 mg/dL (ref 0.0–0.3)
Total Bilirubin: 0.4 mg/dL (ref 0.2–1.2)
Total Protein: 6.9 g/dL (ref 6.0–8.3)

## 2021-03-18 LAB — VITAMIN D 25 HYDROXY (VIT D DEFICIENCY, FRACTURES): VITD: 21.6 ng/mL — ABNORMAL LOW (ref 30.00–100.00)

## 2021-03-18 LAB — HEMOGLOBIN A1C: Hgb A1c MFr Bld: 10.6 % — ABNORMAL HIGH (ref 4.6–6.5)

## 2021-03-18 LAB — BASIC METABOLIC PANEL
BUN: 20 mg/dL (ref 6–23)
CO2: 26 mEq/L (ref 19–32)
Calcium: 9.4 mg/dL (ref 8.4–10.5)
Chloride: 103 mEq/L (ref 96–112)
Creatinine, Ser: 1.61 mg/dL — ABNORMAL HIGH (ref 0.40–1.50)
GFR: 49.12 mL/min — ABNORMAL LOW (ref >60.00)
Glucose, Bld: 276 mg/dL — ABNORMAL HIGH (ref 70–99)
Potassium: 4.1 mEq/L (ref 3.5–5.1)
Sodium: 137 mEq/L (ref 135–145)

## 2021-03-18 LAB — LIPID PANEL
Cholesterol: 209 mg/dL — ABNORMAL HIGH (ref 0–200)
HDL: 42 mg/dL (ref >39.00)
NonHDL: 166.57
Total CHOL/HDL Ratio: 5
Triglycerides: 384 mg/dL — ABNORMAL HIGH (ref 0.0–149.0)
VLDL: 76.8 mg/dL — ABNORMAL HIGH (ref 0.0–40.0)

## 2021-03-18 LAB — LDL CHOLESTEROL, DIRECT: Direct LDL: 132 mg/dL

## 2021-03-19 ENCOUNTER — Encounter: Payer: Self-pay | Admitting: Internal Medicine

## 2021-03-19 ENCOUNTER — Other Ambulatory Visit: Payer: Self-pay | Admitting: Internal Medicine

## 2021-03-19 MED ORDER — ATORVASTATIN CALCIUM 40 MG PO TABS: 40.0000 mg | ORAL_TABLET | Freq: Every day | ORAL | 3 refills | Status: DC

## 2021-03-19 MED ORDER — METFORMIN HCL ER 500 MG PO TB24: 1000.0000 mg | ORAL_TABLET | Freq: Every day | ORAL | 3 refills | Status: DC

## 2021-03-19 MED ORDER — GLIPIZIDE ER 10 MG PO TB24: 10.0000 mg | ORAL_TABLET | Freq: Every day | ORAL | 3 refills | Status: DC

## 2022-03-02 ENCOUNTER — Ambulatory Visit: Payer: BC Managed Care – PPO | Admitting: Internal Medicine

## 2022-03-02 ENCOUNTER — Encounter: Payer: Self-pay | Admitting: Internal Medicine

## 2022-03-02 VITALS — BP 138/70 | HR 58 | Temp 98.7°F | Ht 69.0 in | Wt 251.0 lb

## 2022-03-02 DIAGNOSIS — Z125 Encounter for screening for malignant neoplasm of prostate: Secondary | ICD-10-CM | POA: Diagnosis not present

## 2022-03-02 DIAGNOSIS — Z1159 Encounter for screening for other viral diseases: Secondary | ICD-10-CM | POA: Diagnosis not present

## 2022-03-02 DIAGNOSIS — E7849 Other hyperlipidemia: Secondary | ICD-10-CM

## 2022-03-02 DIAGNOSIS — Z1211 Encounter for screening for malignant neoplasm of colon: Secondary | ICD-10-CM

## 2022-03-02 DIAGNOSIS — E538 Deficiency of other specified B group vitamins: Secondary | ICD-10-CM | POA: Diagnosis not present

## 2022-03-02 DIAGNOSIS — E669 Obesity, unspecified: Secondary | ICD-10-CM | POA: Diagnosis not present

## 2022-03-02 DIAGNOSIS — E1165 Type 2 diabetes mellitus with hyperglycemia: Secondary | ICD-10-CM | POA: Diagnosis not present

## 2022-03-02 DIAGNOSIS — E559 Vitamin D deficiency, unspecified: Secondary | ICD-10-CM

## 2022-03-02 DIAGNOSIS — N200 Calculus of kidney: Secondary | ICD-10-CM

## 2022-03-02 DIAGNOSIS — Z0001 Encounter for general adult medical examination with abnormal findings: Secondary | ICD-10-CM | POA: Diagnosis not present

## 2022-03-02 DIAGNOSIS — I10 Essential (primary) hypertension: Secondary | ICD-10-CM

## 2022-03-02 LAB — CBC WITH DIFFERENTIAL/PLATELET
Basophils Absolute: 0 10*3/uL (ref 0.0–0.1)
Basophils Relative: 0.6 % (ref 0.0–3.0)
Eosinophils Absolute: 0 10*3/uL (ref 0.0–0.7)
Eosinophils Relative: 0.6 % (ref 0.0–5.0)
HCT: 45.2 % (ref 39.0–52.0)
Hemoglobin: 14.6 g/dL (ref 13.0–17.0)
Lymphocytes Relative: 22 % (ref 12.0–46.0)
Lymphs Abs: 1.2 10*3/uL (ref 0.7–4.0)
MCHC: 32.2 g/dL (ref 30.0–36.0)
MCV: 79.1 fl (ref 78.0–100.0)
Monocytes Absolute: 0.6 10*3/uL (ref 0.1–1.0)
Monocytes Relative: 10.7 % (ref 3.0–12.0)
Neutro Abs: 3.6 10*3/uL (ref 1.4–7.7)
Neutrophils Relative %: 66.1 % (ref 43.0–77.0)
Platelets: 193 10*3/uL (ref 150.0–400.0)
RBC: 5.71 Mil/uL (ref 4.22–5.81)
RDW: 14.7 % (ref 11.5–15.5)
WBC: 5.4 10*3/uL (ref 4.0–10.5)

## 2022-03-02 LAB — HEPATIC FUNCTION PANEL
ALT: 23 U/L (ref 0–53)
AST: 14 U/L (ref 0–37)
Albumin: 4.4 g/dL (ref 3.5–5.2)
Alkaline Phosphatase: 87 U/L (ref 39–117)
Bilirubin, Direct: 0.1 mg/dL (ref 0.0–0.3)
Total Bilirubin: 0.4 mg/dL (ref 0.2–1.2)
Total Protein: 7.3 g/dL (ref 6.0–8.3)

## 2022-03-02 LAB — URINALYSIS, ROUTINE W REFLEX MICROSCOPIC
Bilirubin Urine: NEGATIVE
Hgb urine dipstick: NEGATIVE
Ketones, ur: NEGATIVE
Leukocytes,Ua: NEGATIVE
Nitrite: NEGATIVE
Specific Gravity, Urine: <=1.005 — AB (ref 1.000–1.030)
Total Protein, Urine: NEGATIVE
Urine Glucose: >=1000 — AB
Urobilinogen, UA: 0.2 (ref 0.0–1.0)
pH: 6 (ref 5.0–8.0)

## 2022-03-02 LAB — LIPID PANEL
Cholesterol: 281 mg/dL — ABNORMAL HIGH (ref 0–200)
HDL: 49.9 mg/dL (ref >39.00)
NonHDL: 231.53
Total CHOL/HDL Ratio: 6
Triglycerides: 364 mg/dL — ABNORMAL HIGH (ref 0.0–149.0)
VLDL: 72.8 mg/dL — ABNORMAL HIGH (ref 0.0–40.0)

## 2022-03-02 LAB — MICROALBUMIN / CREATININE URINE RATIO
Creatinine,U: 88.2 mg/dL
Microalb Creat Ratio: 2.4 mg/g (ref 0.0–30.0)
Microalb, Ur: 2.1 mg/dL — ABNORMAL HIGH (ref 0.0–1.9)

## 2022-03-02 LAB — VITAMIN D 25 HYDROXY (VIT D DEFICIENCY, FRACTURES): VITD: 30.64 ng/mL (ref 30.00–100.00)

## 2022-03-02 LAB — HEMOGLOBIN A1C: Hgb A1c MFr Bld: 10.2 % — ABNORMAL HIGH (ref 4.6–6.5)

## 2022-03-02 LAB — BASIC METABOLIC PANEL
BUN: 17 mg/dL (ref 6–23)
CO2: 21 mEq/L (ref 19–32)
Calcium: 9.3 mg/dL (ref 8.4–10.5)
Chloride: 102 mEq/L (ref 96–112)
Creatinine, Ser: 1.51 mg/dL — ABNORMAL HIGH (ref 0.40–1.50)
GFR: 52.69 mL/min — ABNORMAL LOW (ref >60.00)
Glucose, Bld: 368 mg/dL — ABNORMAL HIGH (ref 70–99)
Potassium: 4.1 mEq/L (ref 3.5–5.1)
Sodium: 134 mEq/L — ABNORMAL LOW (ref 135–145)

## 2022-03-02 LAB — LDL CHOLESTEROL, DIRECT: Direct LDL: 189 mg/dL

## 2022-03-02 LAB — PSA: PSA: 2 ng/mL (ref 0.10–4.00)

## 2022-03-02 LAB — TSH: TSH: 1.98 u[IU]/mL (ref 0.35–5.50)

## 2022-03-02 LAB — VITAMIN B12: Vitamin B-12: 957 pg/mL — ABNORMAL HIGH (ref 211–911)

## 2022-03-02 MED ORDER — METFORMIN HCL ER 500 MG PO TB24: 1000.0000 mg | ORAL_TABLET | Freq: Every day | ORAL | 3 refills | Status: DC

## 2022-03-02 MED ORDER — TAMSULOSIN HCL 0.4 MG PO CAPS: 0.4000 mg | ORAL_CAPSULE | Freq: Every day | ORAL | 3 refills | Status: DC

## 2022-03-02 MED ORDER — OZEMPIC (0.25 OR 0.5 MG/DOSE) 2 MG/1.5ML ~~LOC~~ SOPN: 0.5000 mg | PEN_INJECTOR | SUBCUTANEOUS | 3 refills | Status: DC

## 2022-03-02 MED ORDER — AMLODIPINE BESYLATE 5 MG PO TABS: ORAL_TABLET | ORAL | 3 refills | Status: DC

## 2022-03-02 MED ORDER — ALBUTEROL SULFATE HFA 108 (90 BASE) MCG/ACT IN AERS: 2.0000 | INHALATION_SPRAY | Freq: Four times a day (QID) | RESPIRATORY_TRACT | 5 refills | Status: AC | PRN

## 2022-03-02 MED ORDER — GLIPIZIDE ER 10 MG PO TB24: 10.0000 mg | ORAL_TABLET | Freq: Every day | ORAL | 3 refills | Status: DC

## 2022-03-02 MED ORDER — ATORVASTATIN CALCIUM 40 MG PO TABS
40.0000 mg | ORAL_TABLET | Freq: Every day | ORAL | 3 refills | Status: DC
Start: 2022-03-02 — End: 2022-09-06

## 2022-03-02 MED ORDER — INDAPAMIDE 1.25 MG PO TABS: 1.2500 mg | ORAL_TABLET | Freq: Every day | ORAL | 3 refills | Status: DC

## 2022-03-02 NOTE — Patient Instructions (Signed)
Please take all new medication as prescribed - the ozempic if covered by your insurance ? ?Please ask your HR dept about coverage for Wegovy if the ozempic is not covered (or mounjaro which is similar) ? ?If not able for any of this, please let us know to consider start Farxiga to help protect kidneys as well ? ?Please take OTC Vitamin D3 at 2000 units per day, indefinitely, as you do ? ?Please continue all other medications as before, and refills have been done if requested. ? ?Please have the pharmacy call with any other refills you may need. ? ?Please continue your efforts at being more active, low cholesterol diet, and weight control. ? ?You are otherwise up to date with prevention measures today. ? ?Please keep your appointments with your specialists as you may have planned ? ?You will be contacted regarding the referral for: colonoscopy ? ?Please go to the LAB at the blood drawing area for the tests to be done ? ?You will be contacted by phone if any changes need to be made immediately.  Otherwise, you will receive a letter about your results with an explanation, but please check with MyChart first. ? ?Please remember to sign up for MyChart if you have not done so, as this will be important to you in the future with finding out test results, communicating by private email, and scheduling acute appointments online when needed. ? ?Please make an Appointment to return in 6 months, or sooner if needed ?

## 2022-03-02 NOTE — Assessment & Plan Note (Signed)
Last vitamin D ?Lab Results  ?Component Value Date  ? VD25OH 21.60 (L) 03/17/2021  ? ?Low, to start oral replacement ? ?

## 2022-03-02 NOTE — Progress Notes (Signed)
Patient ID: Jack IgoDonald D Carney, male   DOB: Apr 11, 1971, 51 y.o.   MRN: [id number] ? ? ? ?     Chief Complaint:: wellness exam and low vit d, ckd, obesity with DM ? ?     HPI:  Jack Carney is a 51 y.o. male here for wellness exam; due for colonoscopy, o/w up to date ?         ?              Also not taking Vit d.  Has not been able to lose wt with diet and exercise.  Pt denies chest pain, increased sob or doe, wheezing, orthopnea, PND, increased LE swelling, palpitations, dizziness or syncope.   Pt denies polydipsia, polyuria, or new focal neuro s/s.    Pt denies fever, wt loss, night sweats, loss of appetite, or other constitutional symptoms   ?  ?Wt Readings from Last 3 Encounters:  ?03/02/22 251 lb (113.9 kg)  ?03/17/21 240 lb (108.9 kg)  ?12/08/20 228 lb (103.4 kg)  ? ?BP Readings from Last 3 Encounters:  ?03/02/22 138/70  ?03/17/21 (!) 150/90  ?12/08/20 (!) 142/90  ? ?There is no immunization history on file for this patient. ?Health Maintenance Due  ?Topic Date Due  ? COLONOSCOPY (Pts [id number] Insurance coverage will need to be confirmed)  Never done  ? ?  ? ?Past Medical History:  ?Diagnosis Date  ? ALLERGIC RHINITIS 05/08/2007  ? Qualifier: Diagnosis of  By: Jonny RuizJohn MD, Len BlalockJames W   ? ASTHMA 05/08/2007  ? Qualifier: Diagnosis of  By: Jonny RuizJohn MD, Len BlalockJames W   ? HYPERLIPIDEMIA 05/08/2007  ? Qualifier: Diagnosis of  By: Jonny RuizJohn MD, Len BlalockJames W   ? ?Past Surgical History:  ?Procedure Laterality Date  ? sinus surgury    ? TONSILLECTOMY    ? ? reports that he has never smoked. He has never used smokeless tobacco. He reports that he does not drink alcohol and does not use drugs. ?family history includes Hypertension in an other family member. ?Allergies  ?Allergen Reactions  ? Sulfonamide Derivatives Nausea And Vomiting  ?  Stomach pains  ? Penicillins Rash  ?  Did it involve swelling of the face/tongue/throat, SOB, or low BP? Y ?Did it involve sudden or severe rash/hives, skin peeling, or any reaction on the inside of your mouth or nose?  N ?Did you need to seek medical attention at a hospital or doctor's office? U ?When did it last happen?       Childhood Allergy ?If all above answers are ?NO?, may proceed with cephalosporin use. ?  ? ?No current outpatient medications on file prior to visit.  ? ?No current facility-administered medications on file prior to visit.  ? ?     ROS:  All others reviewed and negative. ? ?Objective  ? ?     PE:  BP 138/70 (BP Location: Right Arm, Patient Position: Sitting, Cuff Size: Large)   Pulse (!) 58   Temp 98.7 ?F (37.1 ?C) (Oral)   Ht 5\' 9"  (1.753 m)   Wt 251 lb (113.9 kg)   SpO2 95%   BMI 37.07 kg/m?  ? ?              Constitutional: Pt appears in NAD ?              HENT: Head: NCAT.  ?              Right Ear: External ear normal.   ?  Left Ear: External ear normal.  ?              Eyes: . Pupils are equal, round, and reactive to light. Conjunctivae and EOM are normal ?              Nose: without d/c or deformity ?              Neck: Neck supple. Gross normal ROM ?              Cardiovascular: Normal rate and regular rhythm.   ?              Pulmonary/Chest: Effort normal and breath sounds without rales or wheezing.  ?              Abd:  Soft, NT, ND, + BS, no organomegaly ?              Neurological: Pt is alert. At baseline orientation, motor grossly intact ?              Skin: Skin is warm. No rashes, no other new lesions, LE edema - none ?              Psychiatric: Pt behavior is normal without agitation  ? ?Micro: none ? ?Cardiac tracings I have personally interpreted today:  none ? ?Pertinent Radiological findings (summarize): none  ? ?Lab Results  ?Component Value Date  ? WBC 5.4 03/02/2022  ? HGB 14.6 03/02/2022  ? HCT 45.2 03/02/2022  ? PLT 193.0 03/02/2022  ? GLUCOSE 368 (H) 03/02/2022  ? CHOL 281 (H) 03/02/2022  ? TRIG 364.0 (H) 03/02/2022  ? HDL 49.90 03/02/2022  ? LDLDIRECT 189.0 03/02/2022  ? ALT 23 03/02/2022  ? AST 14 03/02/2022  ? NA 134 (L) 03/02/2022  ? K 4.1 03/02/2022  ? CL  102 03/02/2022  ? CREATININE 1.51 (H) 03/02/2022  ? BUN 17 03/02/2022  ? CO2 21 03/02/2022  ? TSH 1.98 03/02/2022  ? PSA 2.00 03/02/2022  ? INR 1.0 12/24/2018  ? HGBA1C 10.2 (H) 03/02/2022  ? MICROALBUR 2.1 (H) 03/02/2022  ? ?Assessment/Plan:  ?Jack Carney is a 51 y.o. Black or African American [2] male with  has a past medical history of ALLERGIC RHINITIS (05/08/2007), ASTHMA (05/08/2007), and HYPERLIPIDEMIA (05/08/2007). ? ?Vitamin D deficiency ?Last vitamin D ?Lab Results  ?Component Value Date  ? VD25OH 21.60 (L) 03/17/2021  ? ?Low, to start oral replacement ? ? ?Encounter for well adult exam with abnormal findings ?Age and sex appropriate education and counseling updated with regular exercise and diet ?Referrals for preventative services - for colonoscopy ?Immunizations addressed - none needed ?Smoking counseling  - none needed ?Evidence for depression or other mood disorder - none significant ?Most recent labs reviewed. ?I have personally reviewed and have noted: ?1) the patient's medical and social history ?2) The patient's current medications and supplements ?3) The patient's height, weight, and BMI have been recorded in the chart ? ? ?Hyperlipidemia ?Direct LDL mg/dL 545.6  256.3 CM  893.7 CM   ?Comment: Optimal:  <100 mg/dLNear or Above Optimal:  100-129   ? ?Severe uncontrolled, goal ldl < 70, pt encouraged for compliance with taking lipitor 40 ? ? ?Essential hypertension ?BP Readings from Last 3 Encounters:  ?03/02/22 138/70  ?03/17/21 (!) 150/90  ?12/08/20 (!) 142/90  ? ?Stable, pt to continue medical treatment norvasc, lozol ? ? ?Diabetes (HCC) ?Lab Results  ?Component Value Date  ? HGBA1C 10.2 (H) 03/02/2022  ? ?  Severe uncontrolled, pt encourage for better compliance with glipizide and metformin, also to start ozempic ? ? ?Obesity (BMI 35.0-39.9 without comorbidity) ?For ozempic as unable to lose wt with diet, activity ? ?Followup: Return in about 6 months (around 09/02/2022). ? ?Oliver Barre, MD  03/06/2022 7:12 AM ?[facility] ?[place] [facility] - [facility] [place] ?Internal Medicine ?

## 2022-03-03 LAB — HEPATITIS C ANTIBODY
Hepatitis C Ab: NONREACTIVE
SIGNAL TO CUT-OFF: 0.02 (ref <1.00)

## 2022-03-06 ENCOUNTER — Encounter: Payer: Self-pay | Admitting: Internal Medicine

## 2022-03-06 NOTE — Assessment & Plan Note (Signed)
Direct LDL mg/dL 189.0  132.0 CM  177.0 CM   ?Comment: Optimal: ?<100 mg/dLNear or Above Optimal: ?100-129   ? ?Severe uncontrolled, goal ldl < 70, pt encouraged for compliance with taking lipitor 40 ? ?

## 2022-03-06 NOTE — Assessment & Plan Note (Signed)
For ozempic as unable to lose wt with diet, activity ?

## 2022-03-06 NOTE — Assessment & Plan Note (Signed)

## 2022-03-06 NOTE — Assessment & Plan Note (Signed)
BP Readings from Last 3 Encounters:  ?03/02/22 138/70  ?03/17/21 (!) 150/90  ?12/08/20 (!) 142/90  ? ?Stable, pt to continue medical treatment norvasc, lozol ? ?

## 2022-03-06 NOTE — Assessment & Plan Note (Signed)
Lab Results  ?Component Value Date  ? HGBA1C 10.2 (H) 03/02/2022  ? ?Severe uncontrolled, pt encourage for better compliance with glipizide and metformin, also to start ozempic ? ?

## 2022-06-15 ENCOUNTER — Encounter: Payer: Self-pay | Admitting: Internal Medicine

## 2022-09-06 ENCOUNTER — Encounter: Payer: Self-pay | Admitting: Internal Medicine

## 2022-09-06 ENCOUNTER — Ambulatory Visit: Payer: BC Managed Care – PPO | Admitting: Internal Medicine

## 2022-09-06 VITALS — BP 144/82 | HR 93 | Temp 97.8°F | Ht 69.0 in | Wt 237.0 lb

## 2022-09-06 DIAGNOSIS — E538 Deficiency of other specified B group vitamins: Secondary | ICD-10-CM

## 2022-09-06 DIAGNOSIS — E559 Vitamin D deficiency, unspecified: Secondary | ICD-10-CM

## 2022-09-06 DIAGNOSIS — N200 Calculus of kidney: Secondary | ICD-10-CM

## 2022-09-06 DIAGNOSIS — E1165 Type 2 diabetes mellitus with hyperglycemia: Secondary | ICD-10-CM | POA: Diagnosis not present

## 2022-09-06 DIAGNOSIS — I10 Essential (primary) hypertension: Secondary | ICD-10-CM | POA: Diagnosis not present

## 2022-09-06 DIAGNOSIS — E7849 Other hyperlipidemia: Secondary | ICD-10-CM | POA: Diagnosis not present

## 2022-09-06 DIAGNOSIS — Z125 Encounter for screening for malignant neoplasm of prostate: Secondary | ICD-10-CM

## 2022-09-06 MED ORDER — TAMSULOSIN HCL 0.4 MG PO CAPS: 0.4000 mg | ORAL_CAPSULE | Freq: Every day | ORAL | 3 refills | Status: AC

## 2022-09-06 MED ORDER — INDAPAMIDE 1.25 MG PO TABS: 1.2500 mg | ORAL_TABLET | Freq: Every day | ORAL | 3 refills | Status: DC

## 2022-09-06 MED ORDER — GLIPIZIDE ER 10 MG PO TB24: 10.0000 mg | ORAL_TABLET | Freq: Every day | ORAL | 3 refills | Status: DC

## 2022-09-06 MED ORDER — ATORVASTATIN CALCIUM 40 MG PO TABS: 40.0000 mg | ORAL_TABLET | Freq: Every day | ORAL | 3 refills | Status: DC

## 2022-09-06 MED ORDER — DEXCOM G7 SENSOR MISC: 3 refills | Status: DC

## 2022-09-06 MED ORDER — OZEMPIC (0.25 OR 0.5 MG/DOSE) 2 MG/1.5ML ~~LOC~~ SOPN: 0.2500 mg | PEN_INJECTOR | SUBCUTANEOUS | 3 refills | Status: DC

## 2022-09-06 MED ORDER — DEXCOM G7 RECEIVER DEVI: 0 refills | Status: DC

## 2022-09-06 MED ORDER — AMLODIPINE BESYLATE 5 MG PO TABS: ORAL_TABLET | ORAL | 3 refills | Status: DC

## 2022-09-06 NOTE — Assessment & Plan Note (Signed)
No results found for: "LDLCALC" Stable, pt to continue current statin liptior 40 mg and lipids with labs

## 2022-09-06 NOTE — Assessment & Plan Note (Signed)
BP Readings from Last 3 Encounters:  09/06/22 (!) 144/82  03/02/22 138/70  03/17/21 (!) 150/90   Uncontrolled, urged med compliance, pt to continue medical treatment norvasc 5 mg , lozol 1.25 mg qd

## 2022-09-06 NOTE — Progress Notes (Signed)
Patient ID: Jack Carney, male   DOB: Jun 09, 1971, 51 y.o.   MRN: [id number]        Chief Complaint: follow up HTN, HLD and DM, obesity       HPI:  Jack Carney is a 51 y.o. male here overall doing ok, but has not been taking most of his medications, trying to get by with supplements, but now realizes his BP is high again today.  Not taking BP meds.   Metformin causes abd pains and diarrhea. Only takes other DM meds off an on.  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 Pt denies chest pain, increased sob or doe, wheezing, orthopnea, PND, increased LE swelling, palpitations, dizziness or syncope.   Pt denies polydipsia, polyuria, or new focal neuro s/s.    Pt denies fever, wt loss, night sweats, loss of appetite, or other constitutional symptoms   Wt Readings from Last 3 Encounters:  09/06/22 237 lb (107.5 kg)  03/02/22 251 lb (113.9 kg)  03/17/21 240 lb (108.9 kg)   BP Readings from Last 3 Encounters:  09/06/22 (!) 144/82  03/02/22 138/70  03/17/21 (!) 150/90         Past Medical History:  Diagnosis Date   ALLERGIC RHINITIS 05/08/2007   Qualifier: Diagnosis of  By: Jonny Ruiz MD, Len Blalock    ASTHMA 05/08/2007   Qualifier: Diagnosis of  By: Jonny Ruiz MD, Len Blalock    HYPERLIPIDEMIA 05/08/2007   Qualifier: Diagnosis of  By: Jonny Ruiz MD, Len Blalock    Past Surgical History:  Procedure Laterality Date   sinus surgury     TONSILLECTOMY      reports that he has never smoked. He has never used smokeless tobacco. He reports that he does not drink alcohol and does not use drugs. family history includes Hypertension in an other family member. Allergies  Allergen Reactions   Metformin And Related    Sulfonamide Derivatives Nausea And Vomiting    Stomach pains   Penicillins Rash    Did it involve swelling of the face/tongue/throat, SOB, or low BP? Y Did it involve sudden or severe rash/hives, skin peeling, or any reaction on the inside of your mouth or nose? N Did you need to seek medical attention at a  hospital or doctor's office? U When did it last happen?       Childhood Allergy If all above answers are "NO", may proceed with cephalosporin use.    Current Outpatient Medications on File Prior to Visit  Medication Sig Dispense Refill   albuterol (VENTOLIN HFA) 108 (90 Base) MCG/ACT inhaler Inhale 2 puffs into the lungs every 6 (six) hours as needed for wheezing or shortness of breath. 1 each 5   No current facility-administered medications on file prior to visit.        ROS:  All others reviewed and negative.  Objective        PE:  BP (!) 144/82 (BP Location: Left Arm, Patient Position: Sitting, Cuff Size: Large)   Pulse 93   Temp 97.8 F (36.6 C) (Oral)   Ht 5\' 9"  (1.753 m)   Wt 237 lb (107.5 kg)   SpO2 96%   BMI 35.00 kg/m                 Constitutional: Pt appears in NAD               HENT: Head: NCAT.  Right Ear: External ear normal.                 Left Ear: External ear normal.                Eyes: . Pupils are equal, round, and reactive to light. Conjunctivae and EOM are normal               Nose: without d/c or deformity               Neck: Neck supple. Gross normal ROM               Cardiovascular: Normal rate and regular rhythm.                 Pulmonary/Chest: Effort normal and breath sounds without rales or wheezing.                Abd:  Soft, NT, ND, + BS, no organomegaly               Neurological: Pt is alert. At baseline orientation, motor grossly intact               Skin: Skin is warm. No rashes, no other new lesions, LE edema - none               Psychiatric: Pt behavior is normal without agitation   Micro: none  Cardiac tracings I have personally interpreted today:  none  Pertinent Radiological findings (summarize): none   Lab Results  Component Value Date   WBC 5.4 03/02/2022   HGB 14.6 03/02/2022   HCT 45.2 03/02/2022   PLT 193.0 03/02/2022   GLUCOSE 368 (H) 03/02/2022   CHOL 281 (H) 03/02/2022   TRIG 364.0 (H) 03/02/2022    HDL 49.90 03/02/2022   LDLDIRECT 189.0 03/02/2022   ALT 23 03/02/2022   AST 14 03/02/2022   NA 134 (L) 03/02/2022   K 4.1 03/02/2022   CL 102 03/02/2022   CREATININE 1.51 (H) 03/02/2022   BUN 17 03/02/2022   CO2 21 03/02/2022   TSH 1.98 03/02/2022   PSA 2.00 03/02/2022   INR 1.0 12/24/2018   HGBA1C 10.2 (H) 03/02/2022   MICROALBUR 2.1 (H) 03/02/2022   Assessment/Plan:  Jack Carney is a 51 y.o. Black or African American [2] male with  has a past medical history of ALLERGIC RHINITIS (05/08/2007), ASTHMA (05/08/2007), and HYPERLIPIDEMIA (05/08/2007).  Hyperlipidemia No results found for: "LDLCALC" Stable, pt to continue current statin liptior 40 mg and lipids with labs   Essential hypertension BP Readings from Last 3 Encounters:  09/06/22 (!) 144/82  03/02/22 138/70  03/17/21 (!) 150/90   Uncontrolled, urged med compliance, pt to continue medical treatment norvasc 5 mg , lozol 1.25 mg qd   Diabetes (HCC) Lab Results  Component Value Date   HGBA1C 10.2 (H) 03/02/2022   Severe uncontrolled and not raking meds recenty, pt to restart current medical treatment glpipizide xl 10 mg qd, d/c metformin due to GI effects, start ozempic 0.25 mg weekly, refer DM education, consider endo referral  Followup: Return in about 6 months (around 03/07/2023).  Oliver Barre, MD 09/06/2022 8:43 PM [facility] [facility] - [facility] Internal Medicine

## 2022-09-06 NOTE — Assessment & Plan Note (Signed)
Lab Results  Component Value Date   HGBA1C 10.2 (H) 03/02/2022   Severe uncontrolled and not raking meds recenty, pt to restart current medical treatment glpipizide xl 10 mg qd, d/c metformin due to GI effects, start ozempic 0.25 mg weekly, refer DM education, consider endo referral

## 2022-09-06 NOTE — Patient Instructions (Addendum)
Please take medications as prescribed  Please continue all other medications as before, and refills have been done if requested.  Please have the pharmacy call with any other refills you may need.  Please continue your efforts at being more active, low cholesterol diet, and weight control.  You are otherwise up to date with prevention measures today.  Please keep your appointments with your specialists as you may have planned  You will be contacted regarding the referral for: Diabetes Education (diet)  You are prescribed the Dexcom G7 system to the pharmacy  Please make an Appointment to return in 6 months, or sooner if needed, also with Lab Appointment for testing done 3-5 days before at the FIRST FLOOR Lab (so this is for TWO appointments - please see the scheduling desk as you leave)

## 2022-09-09 ENCOUNTER — Telehealth: Payer: Self-pay | Admitting: Internal Medicine

## 2022-09-09 NOTE — Telephone Encounter (Signed)
Pt called to report he was told by pharmacy [facility] is needing provider approval.   Rushie [address]nut [address]  Pt phone [phone]  Pt said he has no way to check his blood sugar and he is going out of town next Tuesday 11/23. Please advise so he can have something Monday given to him.

## 2022-09-19 NOTE — Telephone Encounter (Signed)
Dexcom receiver-Key: [id number]  Dexcom sensor-Key: [id number]

## 2022-09-21 NOTE — Telephone Encounter (Signed)
Pt PA for Dexcom G6 sensor denied  Key: [id number]

## 2022-10-06 ENCOUNTER — Encounter: Payer: Self-pay | Admitting: Dietician

## 2022-10-06 ENCOUNTER — Encounter: Payer: BC Managed Care – PPO | Attending: Internal Medicine | Admitting: Dietician

## 2022-10-06 VITALS — Ht 69.0 in | Wt 238.0 lb

## 2022-10-06 DIAGNOSIS — E1165 Type 2 diabetes mellitus with hyperglycemia: Secondary | ICD-10-CM | POA: Insufficient documentation

## 2022-10-06 NOTE — Progress Notes (Signed)
Diabetes Self-Management Education  Visit Type: First/Initial  Appt. Start Time: 1628 Appt. End Time: 1740  10/06/2022  [name], identified by name and date of birth, is a 51 y.o. male with a diagnosis of Diabetes: Type 2.   ASSESSMENT  Primary concern: Pt is concerned about his diabetes and overall health.   History includes: HLD, type 2 diabetes Labs noted: 03/02/22 A1c 10.2% Medications include: glipizide, semaglutide Supplements: not assessed  Pt states he has been on glipizide and started wegovy this week. Pt reports he was on 3 medications but started having stomach issues so he stopped them and slowly started back.   Pt states he was 250 lbs but now is 238 lbs and states that he's lost weight since being on medication.   Pt states he stress eats. Pt reports he recently got a promotion but work is much more stressful for him now. Pt works 6am-4pm but is siwtching 8-5pm.   Pt gets up at 3am daily and does tasks in his house.    Pt stopped drinking soda, and then was drinking juice and then stopped when he found out his blood sugar was high.   Pt states he found out he had diabetes 6 months ago but pt's A1c was 7.3% in 2020.   Height 5\' 9"  (1.753 m), weight 238 lb (108 kg). Body mass index is 35.15 kg/m.   Diabetes Self-Management Education - 10/06/22 1631       Visit Information   Visit Type First/Initial      Initial Visit   Diabetes Type Type 2    Date Diagnosed 2020    Are you currently following a meal plan? No    Are you taking your medications as prescribed? Yes      Health Coping   How would you rate your overall health? Fair      Psychosocial Assessment   Patient Belief/Attitude about Diabetes Motivated to manage diabetes    What is the hardest part about your diabetes right now, causing you the most concern, or is the most worrisome to you about your diabetes?   Making healty food and beverage choices    Self-care barriers None     Self-management support Doctor's office    Other persons present Patient    Patient Concerns Nutrition/Meal planning    Special Needs None    Preferred Learning Style No preference indicated    Learning Readiness Ready    How often do you need to have someone help you when you read instructions, pamphlets, or other written materials from your doctor or pharmacy? 1 - Never    What is the last grade level you completed in school? some college      Pre-Education Assessment   Patient understands the diabetes disease and treatment process. Needs Instruction    Patient understands incorporating nutritional management into lifestyle. Needs Instruction    Patient undertands incorporating physical activity into lifestyle. Needs Instruction    Patient understands using medications safely. Needs Instruction    Patient understands monitoring blood glucose, interpreting and using results Needs Instruction    Patient understands prevention, detection, and treatment of acute complications. Needs Instruction    Patient understands prevention, detection, and treatment of chronic complications. Needs Instruction    Patient understands how to develop strategies to address psychosocial issues. Needs Instruction    Patient understands how to develop strategies to promote health/change behavior. Needs Instruction      Complications   Last HgB A1C  per patient/outside source 10.2 %    How often do you check your blood sugar? 0 times/day (not testing)    Have you had a dilated eye exam in the past 12 months? Yes    Have you had a dental exam in the past 12 months? No    Are you checking your feet? Yes    How many days per week are you checking your feet? 7      Dietary Intake   Breakfast steak bagel from [employer]    Snack (morning) none OR cinnamon applesauce OR plum    Lunch packs lunch: leftovers: sweet potato and green beans    Snack (afternoon) chips    Dinner meat with vegetables and starch    Snack  (evening) SF wafer cookies, 2 mini snickers    Beverage(s) 1L diet cranberry juice, body armor      Activity / Exercise   Activity / Exercise Type ADL's    How many days per week do you exercise? 0    How many minutes per day do you exercise? 0    Total minutes per week of exercise 0      Patient Education   Previous Diabetes Education No    Disease Pathophysiology Factors that contribute to the development of diabetes;Definition of diabetes, type 1 and 2, and the diagnosis of diabetes;Explored patient's options for treatment of their diabetes    Healthy Eating Role of diet in the treatment of diabetes and the relationship between the three main macronutrients and blood glucose level;Food label reading, portion sizes and measuring food.;Carbohydrate counting;Reviewed blood glucose goals for pre and post meals and how to evaluate the patients' food intake on their blood glucose level.;Meal timing in regards to the patients' current diabetes medication.;Information on hints to eating out and maintain blood glucose control.;Meal options for control of blood glucose level and chronic complications.    Being Active Role of exercise on diabetes management, blood pressure control and cardiac health.;Identified with patient nutritional and/or medication changes necessary with exercise.;Helped patient identify appropriate exercises in relation to his/her diabetes, diabetes complications and other health issue.    Medications Reviewed patients medication for diabetes, action, purpose, timing of dose and side effects.;Reviewed medication adjustment guidelines for hyperglycemia and sick days.    Monitoring Identified appropriate SMBG and/or A1C goals.;Daily foot exams;Yearly dilated eye exam    Acute complications Taught prevention, symptoms, and  treatment of hypoglycemia - the 15 rule.;Discussed and identified patients' prevention, symptoms, and treatment of hyperglycemia.    Chronic complications  Relationship between chronic complications and blood glucose control;Reviewed with patient heart disease, higher risk of, and prevention;Nephropathy, what it is, prevention of, the use of ACE, ARB's and early detection of through urine microalbumia.;Retinopathy and reason for yearly dilated eye exams;Dental care;Assessed and discussed foot care and prevention of foot problems;Lipid levels, blood glucose control and heart disease;Identified and discussed with patient  current chronic complications    Diabetes Stress and Support Identified and addressed patients feelings and concerns about diabetes;Worked with patient to identify barriers to care and solutions;Role of stress on diabetes    Lifestyle and Health Coping Lifestyle issues that need to be addressed for better diabetes care      Individualized Goals (developed by patient)   Nutrition General guidelines for healthy choices and portions discussed    Physical Activity Exercise 3-5 times per week;30 minutes per day    Medications take my medication as prescribed    Monitoring  Not Applicable  Problem Solving Eating Pattern    Reducing Risk examine blood glucose patterns;do foot checks daily;treat hypoglycemia with 15 grams of carbs if blood glucose less than 70mg /dL    Health Coping Ask for help with psychological, social, or emotional issues      Post-Education Assessment   Patient understands the diabetes disease and treatment process. Comprehends key points    Patient understands incorporating nutritional management into lifestyle. Comprehends key points    Patient undertands incorporating physical activity into lifestyle. Comprehends key points    Patient understands using medications safely. Demonstrates understanding / competency    Patient understands monitoring blood glucose, interpreting and using results N/A    Patient understands prevention, detection, and treatment of acute complications. Comprehends key points    Patient  understands prevention, detection, and treatment of chronic complications. Comprehends key points    Patient understands how to develop strategies to address psychosocial issues. Comprehends key points    Patient understands how to develop strategies to promote health/change behavior. Comprehends key points      Outcomes   Expected Outcomes Demonstrated interest in learning. Expect positive outcomes    Future DMSE 3-4 months    Program Status Not Completed             Individualized Plan for Diabetes Self-Management Training:   Learning Objective:  Patient will have a greater understanding of diabetes self-management. Patient education plan is to attend individual and/or group sessions per assessed needs and concerns.   Plan:   Patient Instructions  Aim for 150 minutes of physical activity weekly. Goal: Start exercising 3 days a week for 20 minutes.   Eat more Non-Starchy Vegetables  Goal: aim to make 1/2 of your plate vegetables at least 1x/day.  Aim to mix your diet cranberry juice and water 50/50.   Make simple meals at home more often than eating out.  Aim to eat within 1-2 hours of waking up and every 3-5 hours following.    Expected Outcomes:  Demonstrated interest in learning. Expect positive outcomes  Education material provided: ADA - How to Thrive: A Guide for Your Journey with Diabetes, My Plate, and Snack sheet, Meal ideas, balanced plate food groups  If problems or questions, patient to contact team via:  Phone  Future DSME appointment: 3-4 months

## 2022-10-06 NOTE — Patient Instructions (Addendum)
Aim for 150 minutes of physical activity weekly. Goal: Start exercising 3 days a week for 20 minutes.   Eat more Non-Starchy Vegetables  Goal: aim to make 1/2 of your plate vegetables at least 1x/day.  Aim to mix your diet cranberry juice and water 50/50.   Make simple meals at home more often than eating out.  Aim to eat within 1-2 hours of waking up and every 3-5 hours following.

## 2022-10-26 ENCOUNTER — Ambulatory Visit: Payer: BC Managed Care – PPO | Admitting: Dietician

## 2023-01-04 ENCOUNTER — Ambulatory Visit: Payer: BC Managed Care – PPO | Admitting: Dietician

## 2023-01-09 ENCOUNTER — Ambulatory Visit: Payer: BC Managed Care – PPO | Admitting: Dietician

## 2023-01-16 ENCOUNTER — Ambulatory Visit: Payer: BC Managed Care – PPO | Admitting: Dietician

## 2023-02-23 ENCOUNTER — Encounter: Payer: BC Managed Care – PPO | Attending: Internal Medicine | Admitting: Dietician

## 2023-02-23 ENCOUNTER — Encounter: Payer: Self-pay | Admitting: Dietician

## 2023-02-23 VITALS — Wt 244.0 lb

## 2023-02-23 DIAGNOSIS — E119 Type 2 diabetes mellitus without complications: Secondary | ICD-10-CM | POA: Diagnosis present

## 2023-02-23 NOTE — Patient Instructions (Addendum)
Previous Goals: Continue with all  Aim for 150 minutes of physical activity weekly. Goal: Start back exercising 3 days a week for 20 minutes.   Goal: aim to make 1/2 of your plate vegetables at least 1x/day.   Make simple meals at home more often than eating out.   Aim to eat within 1-2 hours of waking up and every 3-5 hours following.   New Goal:   Goal: sit on the back deck and chill at least 1-2 times per week for 30 minutes OR go to a park in [place] and walk or sit  Work on practicing positive coping strategies to refill your cup. (Podcasts, music, audio book, reading, walking)  Consider talking to a therapist.

## 2023-02-23 NOTE — Progress Notes (Signed)
Diabetes Self-Management Education  Visit Type: Follow-up  Appt. Start Time: 0500 Appt. End Time: 0600  02/23/2023  [name], identified by name and date of birth, is a 52 y.o. male with a diagnosis of Diabetes: type 2  .   ASSESSMENT  History includes: HLD, type 2 diabetes Labs noted: 03/02/22 A1c 10.2% Medications include: glipizide, semaglutide Supplements: not assessed  Wt History 02/23/23: 244 lbs 10/06/22: 238 lbs   Pt states he was working out 4 days per week for 20 minutes but then he got sick and fell out of the habit. He has a home gym.   Pt states his eating habits fell off a little when he was sick. He reports his appetite has decreased since starting semaglutide and he no longer snacks much.   Pt states he got down to 232 lbs on his home scale after our previous visit but has slowly gained weight since then and wants to start working on weight loss.   Pt hasn't had another A1c since last May.   Pt goes to bed at 9pm and gets up at 3am to do work around the house. He usually goes back to sleep from 4-6am.  Pt reports work has been highly stressful. He is considering talking to a therapist.   Weight 244 lb (110.7 kg). Body mass index is 36.03 kg/m.   Diabetes Self-Management Education - 02/23/23 1702       Visit Information   Visit Type Follow-up      Health Coping   How would you rate your overall health? Good      Psychosocial Assessment   Patient Belief/Attitude about Diabetes Motivated to manage diabetes    What is the hardest part about your diabetes right now, causing you the most concern, or is the most worrisome to you about your diabetes?   Making healty food and beverage choices    Self-care barriers None    Self-management support Doctor's office    Other persons present Patient    Patient Concerns Nutrition/Meal planning    Special Needs None    Preferred Learning Style No preference indicated    Learning Readiness Ready       Pre-Education Assessment   Patient understands the diabetes disease and treatment process. Needs Review    Patient understands incorporating nutritional management into lifestyle. Needs Review    Patient undertands incorporating physical activity into lifestyle. Needs Review    Patient understands using medications safely. Needs Review    Patient understands monitoring blood glucose, interpreting and using results Needs Review    Patient understands prevention, detection, and treatment of acute complications. Needs Review    Patient understands prevention, detection, and treatment of chronic complications. Needs Review    Patient understands how to develop strategies to address psychosocial issues. Needs Review    Patient understands how to develop strategies to promote health/change behavior. Needs Review      Complications   Last HgB A1C per patient/outside source 10.2 %    How often do you check your blood sugar? 3-4 times / week    Are you checking your feet? Yes      Dietary Intake   Breakfast grits with sausage OR raisin bran and almond milk    Snack (morning) none    Lunch chicken salad on croissant    Snack (afternoon) none    Dinner pork chop with vegetables    Snack (evening) none    Beverage(s) 48 oz water, unsweet tea,  zero sugar cranberry juice      Activity / Exercise   Activity / Exercise Type Light (walking / raking leaves)    How many days per week do you exercise? 3    How many minutes per day do you exercise? 20    Total minutes per week of exercise 60      Patient Education   Previous Diabetes Education Yes (please comment)    Disease Pathophysiology Explored patient's options for treatment of their diabetes    Healthy Eating Role of diet in the treatment of diabetes and the relationship between the three main macronutrients and blood glucose level;Plate Method;Meal options for control of blood glucose level and chronic complications.;Meal timing in regards to  the patients' current diabetes medication.    Being Active Role of exercise on diabetes management, blood pressure control and cardiac health.;Helped patient identify appropriate exercises in relation to his/her diabetes, diabetes complications and other health issue.    Monitoring Identified appropriate SMBG and/or A1C goals.    Chronic complications Relationship between chronic complications and blood glucose control    Diabetes Stress and Support Identified and addressed patients feelings and concerns about diabetes;Role of stress on diabetes    Lifestyle and Health Coping Lifestyle issues that need to be addressed for better diabetes care      Individualized Goals (developed by patient)   Nutrition General guidelines for healthy choices and portions discussed    Physical Activity Exercise 3-5 times per week;30 minutes per day    Medications take my medication as prescribed    Monitoring  Test my blood glucose as discussed    Problem Solving Eating Pattern    Reducing Risk examine blood glucose patterns;do foot checks daily;treat hypoglycemia with 15 grams of carbs if blood glucose less than 70mg /dL    Health Coping Ask for help with psychological, social, or emotional issues      Patient Self-Evaluation of Goals - Patient rates self as meeting previously set goals (% of time)   Nutrition 50 - 75 % (half of the time)    Physical Activity 50 - 75 % (half of the time)    Medications 50 - 75 % (half of the time)    Monitoring 25 - 50% (sometimes)    Problem Solving and behavior change strategies  50 - 75 % (half of the time)    Reducing Risk (treating acute and chronic complications) 50 - 75 % (half of the time)    Health Coping 50 - 75 % (half of the time)      Post-Education Assessment   Patient understands the diabetes disease and treatment process. Comprehends key points    Patient understands incorporating nutritional management into lifestyle. Comprehends key points    Patient  undertands incorporating physical activity into lifestyle. Comprehends key points    Patient understands using medications safely. Demonstrates understanding / competency    Patient understands monitoring blood glucose, interpreting and using results Comprehends key points    Patient understands prevention, detection, and treatment of acute complications. Comprehends key points    Patient understands prevention, detection, and treatment of chronic complications. Comprehends key points    Patient understands how to develop strategies to address psychosocial issues. Comprehends key points    Patient understands how to develop strategies to promote health/change behavior. Comprehends key points      Outcomes   Expected Outcomes Demonstrated interest in learning. Expect positive outcomes    Future DMSE 3-4 months  Program Status Not Completed      Subsequent Visit   Since your last visit have you continued or begun to take your medications as prescribed? Yes             Individualized Plan for Diabetes Self-Management Training:   Learning Objective:  Patient will have a greater understanding of diabetes self-management. Patient education plan is to attend individual and/or group sessions per assessed needs and concerns.   Plan:   Patient Instructions  Previous Goals: Continue with all  Aim for 150 minutes of physical activity weekly. Goal: Start back exercising 3 days a week for 20 minutes.   Goal: aim to make 1/2 of your plate vegetables at least 1x/day.   Make simple meals at home more often than eating out.   Aim to eat within 1-2 hours of waking up and every 3-5 hours following.   New Goal:   Goal: sit on the back deck and chill at least 1-2 times per week for 30 minutes OR go to a park in [place] and walk or sit  Work on practicing positive coping strategies to refill your cup. (Podcasts, music, audio book, reading, walking)  Consider talking to a therapist.    Expected Outcomes:  Demonstrated interest in learning. Expect positive outcomes  Education material provided: No handouts provided on this follow up  If problems or questions, patient to contact team via:  Phone  Future DSME appointment: 3-4 months

## 2023-06-01 ENCOUNTER — Ambulatory Visit: Payer: BC Managed Care – PPO | Admitting: Dietician

## 2023-09-07 ENCOUNTER — Emergency Department (HOSPITAL_COMMUNITY): Payer: BC Managed Care – PPO

## 2023-09-07 ENCOUNTER — Inpatient Hospital Stay (HOSPITAL_COMMUNITY)
Admission: EM | Admit: 2023-09-07 | Discharge: 2023-09-08 | DRG: 065 | Disposition: A | Discharge status: Home / Self Care | Payer: BC Managed Care – PPO | Attending: Family Medicine | Admitting: Family Medicine

## 2023-09-07 ENCOUNTER — Other Ambulatory Visit: Payer: Self-pay

## 2023-09-07 ENCOUNTER — Encounter (HOSPITAL_COMMUNITY): Payer: Self-pay | Admitting: Emergency Medicine

## 2023-09-07 DIAGNOSIS — Z87442 Personal history of urinary calculi: Secondary | ICD-10-CM | POA: Diagnosis not present

## 2023-09-07 DIAGNOSIS — Z91148 Patient's other noncompliance with medication regimen for other reason: Secondary | ICD-10-CM

## 2023-09-07 DIAGNOSIS — Z7982 Long term (current) use of aspirin: Secondary | ICD-10-CM | POA: Diagnosis not present

## 2023-09-07 DIAGNOSIS — M25562 Pain in left knee: Secondary | ICD-10-CM | POA: Diagnosis present

## 2023-09-07 DIAGNOSIS — W1789XA Other fall from one level to another, initial encounter: Secondary | ICD-10-CM | POA: Diagnosis present

## 2023-09-07 DIAGNOSIS — Z7985 Long-term (current) use of injectable non-insulin antidiabetic drugs: Secondary | ICD-10-CM | POA: Diagnosis not present

## 2023-09-07 DIAGNOSIS — R351 Nocturia: Secondary | ICD-10-CM | POA: Diagnosis present

## 2023-09-07 DIAGNOSIS — E785 Hyperlipidemia, unspecified: Secondary | ICD-10-CM | POA: Diagnosis present

## 2023-09-07 DIAGNOSIS — J45909 Unspecified asthma, uncomplicated: Secondary | ICD-10-CM | POA: Diagnosis present

## 2023-09-07 DIAGNOSIS — R29701 NIHSS score 1: Secondary | ICD-10-CM | POA: Diagnosis present

## 2023-09-07 DIAGNOSIS — M47812 Spondylosis without myelopathy or radiculopathy, cervical region: Secondary | ICD-10-CM | POA: Diagnosis present

## 2023-09-07 DIAGNOSIS — G8191 Hemiplegia, unspecified affecting right dominant side: Secondary | ICD-10-CM | POA: Diagnosis present

## 2023-09-07 DIAGNOSIS — I639 Cerebral infarction, unspecified: Secondary | ICD-10-CM | POA: Diagnosis not present

## 2023-09-07 DIAGNOSIS — I10 Essential (primary) hypertension: Secondary | ICD-10-CM | POA: Diagnosis present

## 2023-09-07 DIAGNOSIS — R0683 Snoring: Secondary | ICD-10-CM | POA: Diagnosis present

## 2023-09-07 DIAGNOSIS — Z79899 Other long term (current) drug therapy: Secondary | ICD-10-CM

## 2023-09-07 DIAGNOSIS — E66812 Obesity, class 2: Secondary | ICD-10-CM | POA: Diagnosis present

## 2023-09-07 DIAGNOSIS — Z6836 Body mass index (BMI) 36.0-36.9, adult: Secondary | ICD-10-CM

## 2023-09-07 DIAGNOSIS — E669 Obesity, unspecified: Secondary | ICD-10-CM | POA: Diagnosis present

## 2023-09-07 DIAGNOSIS — M4802 Spinal stenosis, cervical region: Secondary | ICD-10-CM | POA: Diagnosis present

## 2023-09-07 DIAGNOSIS — R29810 Facial weakness: Secondary | ICD-10-CM | POA: Diagnosis present

## 2023-09-07 DIAGNOSIS — Z9089 Acquired absence of other organs: Secondary | ICD-10-CM

## 2023-09-07 DIAGNOSIS — R471 Dysarthria and anarthria: Secondary | ICD-10-CM | POA: Diagnosis present

## 2023-09-07 DIAGNOSIS — Z87440 Personal history of urinary (tract) infections: Secondary | ICD-10-CM

## 2023-09-07 DIAGNOSIS — I6389 Other cerebral infarction: Principal | ICD-10-CM | POA: Diagnosis present

## 2023-09-07 DIAGNOSIS — Z7984 Long term (current) use of oral hypoglycemic drugs: Secondary | ICD-10-CM | POA: Diagnosis not present

## 2023-09-07 DIAGNOSIS — Z7902 Long term (current) use of antithrombotics/antiplatelets: Secondary | ICD-10-CM

## 2023-09-07 DIAGNOSIS — I6381 Other cerebral infarction due to occlusion or stenosis of small artery: Secondary | ICD-10-CM

## 2023-09-07 DIAGNOSIS — Z9889 Other specified postprocedural states: Secondary | ICD-10-CM

## 2023-09-07 DIAGNOSIS — E1165 Type 2 diabetes mellitus with hyperglycemia: Secondary | ICD-10-CM | POA: Diagnosis present

## 2023-09-07 DIAGNOSIS — R42 Dizziness and giddiness: Principal | ICD-10-CM

## 2023-09-07 DIAGNOSIS — R531 Weakness: Secondary | ICD-10-CM

## 2023-09-07 DIAGNOSIS — I6789 Other cerebrovascular disease: Secondary | ICD-10-CM | POA: Diagnosis present

## 2023-09-07 DIAGNOSIS — Z882 Allergy status to sulfonamides status: Secondary | ICD-10-CM

## 2023-09-07 DIAGNOSIS — Z888 Allergy status to other drugs, medicaments and biological substances status: Secondary | ICD-10-CM

## 2023-09-07 DIAGNOSIS — Z880 Allergy status to penicillin: Secondary | ICD-10-CM

## 2023-09-07 DIAGNOSIS — Z8249 Family history of ischemic heart disease and other diseases of the circulatory system: Secondary | ICD-10-CM

## 2023-09-07 DIAGNOSIS — M25561 Pain in right knee: Secondary | ICD-10-CM | POA: Diagnosis present

## 2023-09-07 DIAGNOSIS — Y92008 Other place in unspecified non-institutional (private) residence as the place of occurrence of the external cause: Secondary | ICD-10-CM | POA: Diagnosis not present

## 2023-09-07 LAB — I-STAT CHEM 8, ED
BUN: 14 mg/dL (ref 6–20)
Calcium, Ion: 1.22 mmol/L (ref 1.15–1.40)
Chloride: 104 mmol/L (ref 98–111)
Creatinine, Ser: 1.2 mg/dL (ref 0.61–1.24)
Glucose, Bld: 208 mg/dL — ABNORMAL HIGH (ref 70–99)
HCT: 45 % (ref 39.0–52.0)
Hemoglobin: 15.3 g/dL (ref 13.0–17.0)
Potassium: 4 mmol/L (ref 3.5–5.1)
Sodium: 139 mmol/L (ref 135–145)
TCO2: 24 mmol/L (ref 22–32)

## 2023-09-07 LAB — COMPREHENSIVE METABOLIC PANEL
ALT: 37 U/L (ref 0–44)
AST: 22 U/L (ref 15–41)
Albumin: 4.1 g/dL (ref 3.5–5.0)
Alkaline Phosphatase: 77 U/L (ref 38–126)
Anion gap: 9 (ref 5–15)
BUN: 15 mg/dL (ref 6–20)
CO2: 24 mmol/L (ref 22–32)
Calcium: 9.2 mg/dL (ref 8.9–10.3)
Chloride: 102 mmol/L (ref 98–111)
Creatinine, Ser: 1.2 mg/dL (ref 0.61–1.24)
GFR, Estimated: >60 mL/min (ref >60)
Glucose, Bld: 210 mg/dL — ABNORMAL HIGH (ref 70–99)
Potassium: 3.8 mmol/L (ref 3.5–5.1)
Sodium: 135 mmol/L (ref 135–145)
Total Bilirubin: 0.7 mg/dL (ref <1.2)
Total Protein: 7.8 g/dL (ref 6.5–8.1)

## 2023-09-07 LAB — CBC WITH DIFFERENTIAL/PLATELET
Abs Immature Granulocytes: 0.04 10*3/uL (ref 0.00–0.07)
Basophils Absolute: 0 10*3/uL (ref 0.0–0.1)
Basophils Relative: 0 %
Eosinophils Absolute: 0.2 10*3/uL (ref 0.0–0.5)
Eosinophils Relative: 3 %
HCT: 42.9 % (ref 39.0–52.0)
Hemoglobin: 14.1 g/dL (ref 13.0–17.0)
Immature Granulocytes: 1 %
Lymphocytes Relative: 24 %
Lymphs Abs: 1.3 10*3/uL (ref 0.7–4.0)
MCH: 26.5 pg (ref 26.0–34.0)
MCHC: 32.9 g/dL (ref 30.0–36.0)
MCV: 80.5 fL (ref 80.0–100.0)
Monocytes Absolute: 0.7 10*3/uL (ref 0.1–1.0)
Monocytes Relative: 12 %
Neutro Abs: 3.3 10*3/uL (ref 1.7–7.7)
Neutrophils Relative %: 60 %
Platelets: 217 10*3/uL (ref 150–400)
RBC: 5.33 MIL/uL (ref 4.22–5.81)
RDW: 14.3 % (ref 11.5–15.5)
WBC: 5.5 10*3/uL (ref 4.0–10.5)
nRBC: 0 % (ref 0.0–0.2)

## 2023-09-07 LAB — PROTIME-INR
INR: 0.9 (ref 0.8–1.2)
Prothrombin Time: 12.8 s (ref 11.4–15.2)

## 2023-09-07 LAB — RAPID URINE DRUG SCREEN, HOSP PERFORMED
Amphetamines: NOT DETECTED
Barbiturates: NOT DETECTED
Benzodiazepines: NOT DETECTED
Cocaine: NOT DETECTED
Opiates: NOT DETECTED
Tetrahydrocannabinol: NOT DETECTED

## 2023-09-07 LAB — TROPONIN I (HIGH SENSITIVITY)
Troponin I (High Sensitivity): 6 ng/L (ref <18)
Troponin I (High Sensitivity): 5 ng/L (ref <18)

## 2023-09-07 LAB — HEMOGLOBIN A1C
Hgb A1c MFr Bld: 9.7 % — ABNORMAL HIGH (ref 4.8–5.6)
Mean Plasma Glucose: 231.69 mg/dL

## 2023-09-07 LAB — APTT: aPTT: 25 s (ref 24–36)

## 2023-09-07 LAB — ETHANOL: Alcohol, Ethyl (B): <10 mg/dL (ref <10)

## 2023-09-07 LAB — CBG MONITORING, ED
Glucose-Capillary: 209 mg/dL — ABNORMAL HIGH (ref 70–99)
Glucose-Capillary: 245 mg/dL — ABNORMAL HIGH (ref 70–99)

## 2023-09-07 LAB — LIPASE, BLOOD: Lipase: 41 U/L (ref 11–51)

## 2023-09-07 MED ORDER — ASPIRIN 81 MG PO TBEC
81.0000 mg | DELAYED_RELEASE_TABLET | Freq: Every day | ORAL | Status: DC
Start: 2023-09-08 — End: 2023-09-08
  Administered 2023-09-08: 81 mg via ORAL
  Filled 2023-09-07: qty 1

## 2023-09-07 MED ORDER — ONDANSETRON HCL 4 MG PO TABS: 4.0000 mg | ORAL_TABLET | Freq: Four times a day (QID) | ORAL | Status: DC | PRN

## 2023-09-07 MED ORDER — ENOXAPARIN SODIUM 60 MG/0.6ML IJ SOSY
60.0000 mg | PREFILLED_SYRINGE | INTRAMUSCULAR | Status: DC
  Administered 2023-09-07: 60 mg via SUBCUTANEOUS
  Filled 2023-09-07 (×2): qty 0.6

## 2023-09-07 MED ORDER — ASPIRIN 325 MG PO TABS
325.0000 mg | ORAL_TABLET | Freq: Once | ORAL | Status: AC
  Administered 2023-09-07: 325 mg via ORAL
  Filled 2023-09-07: qty 1

## 2023-09-07 MED ORDER — INSULIN ASPART 100 UNIT/ML IJ SOLN
0.0000 [IU] | Freq: Three times a day (TID) | INTRAMUSCULAR | Status: DC
  Administered 2023-09-07: 7 [IU] via SUBCUTANEOUS
  Administered 2023-09-08: 3 [IU] via SUBCUTANEOUS
  Administered 2023-09-08: 7 [IU] via SUBCUTANEOUS
  Administered 2023-09-08: 3 [IU] via SUBCUTANEOUS
  Filled 2023-09-07: qty 0.2

## 2023-09-07 MED ORDER — CLOPIDOGREL BISULFATE 300 MG PO TABS
300.0000 mg | ORAL_TABLET | Freq: Once | ORAL | Status: AC
  Administered 2023-09-07: 300 mg via ORAL
  Filled 2023-09-07: qty 1

## 2023-09-07 MED ORDER — ACETAMINOPHEN 650 MG RE SUPP: 650.0000 mg | Freq: Four times a day (QID) | RECTAL | Status: DC | PRN

## 2023-09-07 MED ORDER — TAMSULOSIN HCL 0.4 MG PO CAPS
0.4000 mg | ORAL_CAPSULE | Freq: Every day | ORAL | Status: DC
  Administered 2023-09-08: 0.4 mg via ORAL
  Filled 2023-09-07: qty 1

## 2023-09-07 MED ORDER — STROKE: EARLY STAGES OF RECOVERY BOOK: Freq: Once | Status: DC

## 2023-09-07 MED ORDER — ALBUTEROL SULFATE (2.5 MG/3ML) 0.083% IN NEBU: 2.5000 mg | INHALATION_SOLUTION | Freq: Four times a day (QID) | RESPIRATORY_TRACT | Status: DC | PRN

## 2023-09-07 MED ORDER — ALBUTEROL SULFATE HFA 108 (90 BASE) MCG/ACT IN AERS: 2.0000 | INHALATION_SPRAY | Freq: Four times a day (QID) | RESPIRATORY_TRACT | Status: DC | PRN

## 2023-09-07 MED ORDER — ONDANSETRON HCL 4 MG/2ML IJ SOLN: 4.0000 mg | Freq: Four times a day (QID) | INTRAMUSCULAR | Status: DC | PRN

## 2023-09-07 MED ORDER — CLOPIDOGREL BISULFATE 75 MG PO TABS
75.0000 mg | ORAL_TABLET | Freq: Every day | ORAL | Status: DC
  Administered 2023-09-08: 75 mg via ORAL
  Filled 2023-09-07: qty 1

## 2023-09-07 MED ORDER — ACETAMINOPHEN 325 MG PO TABS: 650.0000 mg | ORAL_TABLET | Freq: Four times a day (QID) | ORAL | Status: DC | PRN

## 2023-09-07 MED ORDER — ENSURE ENLIVE PO LIQD: 237.0000 mL | Freq: Two times a day (BID) | ORAL | Status: DC

## 2023-09-07 MED ORDER — STROKE: EARLY STAGES OF RECOVERY BOOK
Freq: Once | Status: AC
Start: 2023-09-08 — End: 2023-09-08
  Filled 2023-09-07: qty 1

## 2023-09-07 MED ORDER — HYDRALAZINE HCL 20 MG/ML IJ SOLN: 10.0000 mg | INTRAMUSCULAR | Status: DC | PRN

## 2023-09-07 MED ORDER — GLIPIZIDE ER 10 MG PO TB24
10.0000 mg | ORAL_TABLET | Freq: Every day | ORAL | Status: DC
  Administered 2023-09-08: 10 mg via ORAL
  Filled 2023-09-07: qty 1

## 2023-09-07 MED ORDER — IOHEXOL 350 MG/ML SOLN
75.0000 mL | Freq: Once | INTRAVENOUS | Status: AC | PRN
  Administered 2023-09-07: 75 mL via INTRAVENOUS

## 2023-09-07 NOTE — ED Notes (Signed)
Patient transported to MRI 

## 2023-09-07 NOTE — ED Notes (Signed)
[facility] called for report. Report given to Alfredia Client RN

## 2023-09-07 NOTE — ED Triage Notes (Signed)
Patient arrives ambulatory by POV coming from work states he got there a little early this morning about 6:30 and sneezed then became dizzy. States dizziness has subsided. Reports feeling disorientated and fatigued. States his right leg is not cooperating and feels like he is dragging it along. Initially had numbness to bilateral hands that has also subsided.

## 2023-09-07 NOTE — ED Notes (Signed)
Pt placed back on 5 lead cardiac monitoring.

## 2023-09-07 NOTE — ED Provider Notes (Signed)
[place] [facility] AT [facility] Provider Note   CSN: [id number] Arrival date & time: 09/07/23  4098     History Chief Complaint  Patient presents with   Dizziness    HPI Jack Carney is a 52 y.o. male presenting for chief complaint of episode of dizziness and right leg discoordination that occurred this morning after sneezing.. I was called emergently to bedside to evaluate for possible code stroke.  Patient's recorded medical, surgical, social, medication list and allergies were reviewed in the Snapshot window as part of the initial history.   Review of Systems   Review of Systems  Constitutional:  Negative for chills and fever.  HENT:  Negative for ear pain and sore throat.   Eyes:  Negative for pain and visual disturbance.  Respiratory:  Negative for cough and shortness of breath.   Cardiovascular:  Negative for chest pain and palpitations.  Gastrointestinal:  Negative for abdominal pain and vomiting.  Genitourinary:  Negative for dysuria and hematuria.  Musculoskeletal:  Negative for arthralgias and back pain.  Skin:  Negative for color change and rash.  Neurological:  Positive for dizziness and weakness. Negative for seizures and syncope.  All other systems reviewed and are negative.   Physical Exam Updated Vital Signs BP (!) 182/119   Pulse 85   Temp (!) 97.4 F (36.3 C) (Oral)   Resp 19   Ht 5\' 9"  (1.753 m)   Wt 110.7 kg   SpO2 100%   BMI 36.03 kg/m  Physical Exam Vitals and nursing note reviewed.  Constitutional:      General: He is not in acute distress.    Appearance: He is well-developed.  HENT:     Head: Normocephalic and atraumatic.  Eyes:     Conjunctiva/sclera: Conjunctivae normal.  Cardiovascular:     Rate and Rhythm: Normal rate and regular rhythm.     Heart sounds: No murmur heard. Pulmonary:     Effort: Pulmonary effort is normal. No respiratory distress.     Breath sounds: Normal breath sounds.  Abdominal:      Palpations: Abdomen is soft.     Tenderness: There is no abdominal tenderness.  Musculoskeletal:        General: No swelling.     Cervical back: Neck supple.  Skin:    General: Skin is warm and dry.     Capillary Refill: Capillary refill takes less than 2 seconds.  Neurological:     Mental Status: He is alert and oriented to person, place, and time.     Cranial Nerves: No cranial nerve deficit.     Sensory: No sensory deficit.     Motor: Weakness present.     Coordination: Coordination normal.     Comments: Initial neurologic exam benign.  2 out of 5 right lower extremity weakness was the only acute finding.  Psychiatric:        Mood and Affect: Mood normal.      ED Course/ Medical Decision Making/ A&P Clinical Course as of 09/07/23 1512  Thu Sep 07, 2023  0957 F/U MRI [CC]    Clinical Course User Index [CC] Glyn Ade, MD    Procedures .Critical Care  Performed by: Glyn Ade, MD Authorized by: Glyn Ade, MD   Critical care provider statement:    Critical care time (minutes):  85   Critical care was time spent personally by me on the following activities:  Development of treatment plan with patient or surrogate, discussions  with consultants, evaluation of patient's response to treatment, examination of patient, ordering and review of laboratory studies, ordering and review of radiographic studies, ordering and performing treatments and interventions, pulse oximetry, re-evaluation of patient's condition and review of old charts   Care discussed with: admitting provider      Medications Ordered in ED Medications   stroke: early stages of recovery book (has no administration in time range)   stroke: early stages of recovery book (has no administration in time range)  enoxaparin (LOVENOX) injection 60 mg (has no administration in time range)  acetaminophen (TYLENOL) tablet 650 mg (has no administration in time range)    Or  acetaminophen  (TYLENOL) suppository 650 mg (has no administration in time range)  ondansetron (ZOFRAN) tablet 4 mg (has no administration in time range)    Or  ondansetron (ZOFRAN) injection 4 mg (has no administration in time range)  hydrALAZINE (APRESOLINE) injection 10 mg (has no administration in time range)  iohexol (OMNIPAQUE) 350 MG/ML injection 75 mL (75 mLs Intravenous Contrast Given 09/07/23 0823)  clopidogrel (PLAVIX) tablet 300 mg (300 mg Oral Given 09/07/23 1139)  aspirin tablet 325 mg (325 mg Oral Given 09/07/23 1139)   Medical Decision Making:    Jack Carney is a 52 y.o. male  who presented to the ED today with RLE weakness.  Due to these symptoms, nursing activated a CODE STROKE per hospital protocol.   Patient placed on continuous vitals and telemetry monitoring while in ED which was reviewed periodically.   On my initial exam, the pt was in no acute distress, glucose was WNL and deficits include RLE weakness.  Deficits are  persistent.   Reviewed and confirmed nursing documentation for past medical history, family history, social history.   Initial Assessment and Plan:   Patient immediately evaluated jointly by teleneurology and emergency department providers.   This is most consistent with an acute life/limb threatening illness complicated by underlying chronic conditions. Patient evaluated per code stroke protocol with immediate cross-sectional imaging of the head via CT head to evaluate for intracranial hemorrhage.  This was augmented with CT angiography and perfusional imaging of the brain for further evaluation of large vessel occlusion.   Per neurology, these rapid studies revealed no acute pathology. Discussed TNKase administration and consultation with neurology.  Will hold due to low NIH making risk-benefit not in favor per their recommendations. Neurology feels that patient's presentation is more consistent with alternative pathology given these findings.  Differential  includes metabolic encephalopathy, medication encephalopathy, infectious encephalopathy. Neurology has recommended an MRI for further differentiation of patient's syndrome and evaluation for any ischemic disease while completing a metabolic/infectious workup with laboratory evaluation per EMR.  Initial Study Results: Labs Labs reviewed without evidence of clinically relevant abnormality. EKG EKG was reviewed independently. Rate, rhythm, axis, intervals all examined and without medically relevant abnormality. ST segments without concerns for elevations.    Radiology  Images reviewed independently, agree with radiology report at this time.   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  Neurology has recommended admission for further medical optimization.  Disposition:  Based on the above findings, I believe this patient is stable for  admission.    Patient/family educated about specific findings on our evaluation and explained exact reasons for admission.  Patient/family educated about clinical situation and time was allowed to answer questions.   Admission team communicated with and agreed with need for admission. Patient admitted. Patient  ready to move at this time.     Emergency Department Medication Summary:   Medications   stroke: early stages of recovery book (has no administration in time range)   stroke: early stages of recovery book (has no administration in time range)  enoxaparin (LOVENOX) injection 60 mg (has no administration in time range)  acetaminophen (TYLENOL) tablet 650 mg (has no administration in time range)    Or  acetaminophen (TYLENOL) suppository 650 mg (has no administration in time range)  ondansetron (ZOFRAN) tablet 4 mg (has no  administration in time range)    Or  ondansetron (ZOFRAN) injection 4 mg (has no administration in time range)  hydrALAZINE (APRESOLINE) injection 10 mg (has no administration in time range)  iohexol (OMNIPAQUE) 350 MG/ML injection 75 mL (75 mLs Intravenous Contrast Given 09/07/23 0823)  clopidogrel (PLAVIX) tablet 300 mg (300 mg Oral Given 09/07/23 1139)  aspirin tablet 325 mg (325 mg Oral Given 09/07/23 1139)       .   Clinical Impression:  1. Dizziness   2. Cerebrovascular accident (CVA), unspecified mechanism (HCC)      Admit   Final Clinical Impression(s) / ED Diagnoses Final diagnoses:  Dizziness  Cerebrovascular accident (CVA), unspecified mechanism (HCC)    Rx / DC Orders ED Discharge Orders     None         Glyn Ade, MD 09/07/23 1512

## 2023-09-07 NOTE — ED Notes (Signed)
Patient taken back to room and EDP notified of symptoms and LKN.

## 2023-09-07 NOTE — Progress Notes (Signed)
SLP Cancellation Note  Patient Details Name: Jack Carney MRN: [id number] DOB: [birth year]   Cancelled treatment:       Reason Eval/Treat Not Completed: Patient at procedure or test/unavailable;pt NPO, but passed [facility]: orders for cognitive evaluation.  ST will continue efforts.   Pat Elly Haffey,M.S.,CCC-SLP 09/07/2023, 11:46 AM

## 2023-09-07 NOTE — Progress Notes (Signed)
Code stroke cart activated at 0759. Teleneurologist paged at 0801. Dr. Iver Nestle called me for report at 0802. Pt to CT at 0808.  Dr. Iver Nestle on screen at 0812-assessed pt while in CT. Pt back in room from CT at 0828.  Ricci Barker, Multimedia programmer

## 2023-09-07 NOTE — H&P (Signed)
History and Physical    Patient: Jack Carney:[id number] DOB: 11-17-1970 DOA: 09/07/2023 DOS: the patient was seen and examined on 09/07/2023 PCP: Corwin Levins, MD  Patient coming from: Home  Chief Complaint:  Chief Complaint  Patient presents with   Dizziness   HPI: ILIAN Carney is a 52 y.o. male with medical history significant of acute pyelonephritis, nephrolithiasis, ventricular bigeminy, vitamin D deficiency, class II obesity, type 2 diabetes, hyperlipidemia, hypertension, allergic rhinitis, asthma who presented to the emergency department complaints of dizziness after sneezing earlier this morning.  Subsequently the dizziness disappeared, but he feels disoriented and fatigued.  He has also noticed that his hands were numb and RLE is weaker than usual.  The numbness in the hands has disappeared but still his RLE was feeling weak at the time of arrival. He denied fever, chills, rhinorrhea, sore throat, wheezing or hemoptysis.  No chest pain, palpitations, diaphoresis, PND, orthopnea or pitting edema of the lower extremities.  No abdominal pain, nausea, emesis, diarrhea, constipation, melena or hematochezia.  No flank pain, dysuria, frequency or hematuria.  He has been having frequent nocturia, occasional polyuria and polydipsia.  Lab work: UDS was negative.  CBC, PT, INR and PTT were normal.  Alcohol level, troponin x 2, lipase and CMP were unremarkable with the exception of a glucose level of 210 mg/dL.  Imaging: CT head code stroke was normal.  CTA head and neck with no significant proximal stenosis, aneurysm, or branch vessel occlusion within the circle of Willis.  Normal CTA of the neck.  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  No acute hemorrhage or significant mass effect.  MRI of the cervical spine showing multilevel cervical spondylosis, worst at C3-C4,  where there is severe left neuroforaminal narrowing.  Moderate to severe right neuroforaminal narrowing C5-C6.   ED course: Initial vital signs were temperature 97.8 F, pulse 77, respirations 16, BP 182/109 mmHg O2 sat 100% on room air.  The patient received aspirin 325 mg and clopidogrel 300 mg p.o. x 1 each.  Review of Systems: As mentioned in the history of present illness. All other systems reviewed and are negative. Past Medical History:  Diagnosis Date   ALLERGIC RHINITIS 05/08/2007   Qualifier: Diagnosis of  By: Jonny Ruiz MD, Len Blalock    ASTHMA 05/08/2007   Qualifier: Diagnosis of  By: Jonny Ruiz MD, Len Blalock    HYPERLIPIDEMIA 05/08/2007   Qualifier: Diagnosis of  By: Jonny Ruiz MD, Len Blalock    Past Surgical History:  Procedure Laterality Date   sinus surgury     TONSILLECTOMY     Social History:  reports that he has never smoked. He has never used smokeless tobacco. He reports that he does not drink alcohol and does not use drugs.  Allergies  Allergen Reactions   Metformin And Related    Sulfonamide Derivatives Nausea And Vomiting    Stomach pains   Penicillins Rash    Did it involve swelling of the face/tongue/throat, SOB, or low BP? Y Did it involve sudden or severe rash/hives, skin peeling, or any reaction on the inside of your mouth or nose? N Did you need to seek medical attention at a hospital or doctor's office? U When did it last happen?       Childhood Allergy If all above answers are "NO", may proceed with cephalosporin use.     Family  History  Problem Relation Age of Onset   Hypertension Other     Prior to Admission medications   Medication Sig Start Date End Date Taking? Authorizing Provider  albuterol (VENTOLIN HFA) 108 (90 Base) MCG/ACT inhaler Inhale 2 puffs into the lungs every 6 (six) hours as needed for wheezing or shortness of breath. 03/02/22   Corwin Levins, MD  amLODipine (NORVASC) 5 MG tablet TAKE 1 TABLET(5 MG) BY MOUTH DAILY 09/06/22   Corwin Levins, MD   atorvastatin (LIPITOR) 40 MG tablet Take 1 tablet (40 mg total) by mouth daily. 09/06/22   Corwin Levins, MD  Continuous Blood Gluc Receiver (DEXCOM G7 RECEIVER) DEVI Use as directed once daily E11.9 09/06/22   Corwin Levins, MD  Continuous Blood Gluc Sensor (DEXCOM G7 SENSOR) MISC Use as directed topically x 10 days  E11.9 09/06/22   Corwin Levins, MD  glipiZIDE (GLUCOTROL XL) 10 MG 24 hr tablet Take 1 tablet (10 mg total) by mouth daily with breakfast. 09/06/22   Corwin Levins, MD  indapamide (LOZOL) 1.25 MG tablet Take 1 tablet (1.25 mg total) by mouth daily. 09/06/22   Corwin Levins, MD  Semaglutide,0.25 or 0.5MG /DOS, (OZEMPIC, 0.25 OR 0.5 MG/DOSE,) 2 MG/1.5ML SOPN Inject 0.25 mg into the skin once a week. 09/06/22   Corwin Levins, MD  tamsulosin (FLOMAX) 0.4 MG CAPS capsule Take 1 capsule (0.4 mg total) by mouth daily. 09/06/22   Corwin Levins, MD    Physical Exam: Vitals:   09/07/23 0743 09/07/23 0900 09/07/23 1142  BP: (!) 182/109 (!) 164/102   Pulse: 77 79   Resp: 16 (!) 22 19  Temp: 97.8 F (36.6 C)  (!) 97.4 F (36.3 C)  TempSrc: Oral  Oral  SpO2: 100% 100%   Weight: 110.7 kg    Height: 5\' 9"  (1.753 m)     Physical Exam Constitutional:      General: He is awake. He is not in acute distress.    Appearance: He is obese. He is ill-appearing.  HENT:     Head: Normocephalic.     Nose: No rhinorrhea.     Mouth/Throat:     Mouth: Mucous membranes are moist.  Eyes:     General: No scleral icterus.    Pupils: Pupils are equal, round, and reactive to light.  Neck:     Vascular: No JVD.  Cardiovascular:     Rate and Rhythm: Normal rate and regular rhythm.     Heart sounds: S1 normal and S2 normal.  Pulmonary:     Effort: Pulmonary effort is normal.     Breath sounds: Normal breath sounds. No wheezing.  Abdominal:     General: Bowel sounds are normal.     Palpations: Abdomen is soft.     Tenderness: There is no abdominal tenderness.  Musculoskeletal:     Cervical back:  Neck supple.     Right lower leg: No edema.     Left lower leg: No edema.  Neurological:     General: No focal deficit present.     Mental Status: He is alert and oriented to person, place, and time.     Motor: Weakness present.     Comments: RLE weakness and tremor.  Psychiatric:        Mood and Affect: Mood normal.        Behavior: Behavior normal. Behavior is cooperative.     Data Reviewed:  Results are pending, will review when  available.  EKG: Vent. rate 80 BPM PR interval 171 ms QRS duration 88 ms QT/QTcB 404/466 ms P-R-T axes 65 40 42 Sinus rhythm  Assessment and Plan: Principal Problem:   Acute CVA (cerebrovascular accident) (HCC) Telemetry/inpatient. Frequent neurochecks. Allow permissive hypertension. Consult PT and OT. Check fasting lipids. Check hemoglobin A1c. Check echocardiogram. Risk factors modifications. Stroke team input appreciated.  Active Problems:   Hyperlipidemia Check fasting lipids. Resume atorvastatin in AM after blood draw.    Asthma Continue albuterol MDI as needed.    Essential hypertension Holding antihypertensives.    Obesity (BMI 35.0-39.9 without comorbidity) Class II category. Current BMI 36.03 kg/m. Lifestyle modifications. Follow-up with closely PCP.Marland Kitchen    Type 2 diabetes mellitus with hyperglycemia (HCC) Carbohydrate modified diet. CBG monitoring with RI SS. Check hemoglobin A1c.    Advance Care Planning:   Code Status: Full Code   Consults: Teleneurology Brooke Dare, MD).  Neurohospitalist team.  Family Communication:   Severity of Illness: The appropriate patient status for this patient is INPATIENT. Inpatient status is judged to be reasonable and necessary in order to provide the required intensity of service to ensure the patient's safety. The patient's presenting symptoms, physical exam findings, and initial radiographic and laboratory data in the context of their chronic comorbidities is felt to place  them at high risk for further clinical deterioration. Furthermore, it is not anticipated that the patient will be medically stable for discharge from the hospital within 2 midnights of admission.   * I certify that at the point of admission it is my clinical judgment that the patient will require inpatient hospital care spanning beyond 2 midnights from the point of admission due to high intensity of service, high risk for further deterioration and high frequency of surveillance required.*  Author: Bobette Mo, MD 09/07/2023 12:15 PM  For on call review [email or web address].   This document was prepared using Dragon voice recognition software and may contain some unintended transcription errors.

## 2023-09-07 NOTE — Consult Note (Addendum)
[facility] Telemedicine Consult   Requesting Provider: Glyn Ade  Consult Participants: myself, bedside nurse in [employer], atrium nurse Misty, Location of the provider: [facility] Location of the patient: [facility]  This consult was provided via telemedicine with 2-way video and audio communication. The patient/family was informed that care would be provided in this way and agreed to receive care in this manner.    Chief Complaint: Bilateral arm tingling and right leg weakness after sneezing  HPI: This is a 52 year old right-handed gentleman with a past medical history significant for hypertension, hyperlipidemia, diabetes, obesity, snoring (sleep study negative for sleep apnea), asthma with cough,  He notes that 2 weeks ago he was taking his trash out when he missed the last step on his deck falling first forward onto his knees and then his head with and backwards with him sitting on the step for a moment.  He had quite a bit of knee pain which initially improved over the first week but has been now worsening.  Today he had been driving into work when he sneezed and subsequently had bilateral arm tingling, right leg weakness and dizziness, disorientation, fatigue.  He noticed some difficulty when signing his name.  Subtle right hand weakness affecting his handwriting remains and he also feels like he has some leg weakness.  No bowel or bladder symptoms.  Has also been having numbness of the right trap x 1 month, which he mostly notices when he uses 2 pillows, which he has been using due to some shortness of breath laying flat.  He does also have some shortness of breath with exertion which he has been attributing to worsening asthma symptoms in the setting of the fall season.  On full review of systems he does report some stress due to family issues (son traveling in [place], difficult relationship with ex-wife)  LKW: 6:30 AM  Thrombolytic  given?: No, discussed with patient who agreed symptoms were too mild to treat CTA prior to TNK due to potential for dissection (symptoms started after sneezing) IR Thrombectomy? No, exam not consistent with LVO MRS: 0 Time of teleneurologist evaluation: 8:10 AM  Exam: Vitals:   09/07/23 0743  BP: (!) 182/109  Pulse: 77  Resp: 16  Temp: 97.8 F (36.6 C)  SpO2: 100%    General: No acute distress Pulmonary: breathing comfortably Cardiac: regular rate and rhythm on monitor   NIH Stroke scale 1A: Level of Consciousness - 0 1B: Ask Month and Age - 52 1C: 'Blink Eyes' & 'Squeeze Hands' - 0 2: Test Horizontal Extraocular Movements - 0 3: Test Visual Fields - 0 4: Test Facial Palsy - 0 5A: Test Left Arm Motor Drift - 0 5B: Test Right Arm Motor Drift - 0 6A: Test Left Leg Motor Drift - 0 6B: Test Right Leg Motor Drift - 0 7: Test Limb Ataxia - 0 8: Test Sensation - 1 mild numbness in the right hand only 9: Test Language/Aphasia- 0 10: Test Dysarthria - 0 11: Test Extinction/Inattention - 0 NIHSS score: 1  Gait: Steady, slightly slow, able to raise on heels, slight RLE tremor raising on toes --not disabling weakness  Imaging Reviewed:   Head CT personally reviewed, agree with radiology:   no acute intracranial process   CTA personally reviewed, agree with radiology, normal study, 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  Additionally symptoms are nondisabling at this time  and have been rapidly resolving over the course of evaluation.  Therefore risks of TNK are not outweighed by the benefits, which patient understands and agrees with.  However he does have significant stroke risk factors and if MRI cervical spine is completely normal (and MRI brain is normal), would still favor admission for TIA workup given his significant hypertension as well as his recent potential cardiac symptoms with increased shortness of breath and orthopnea (a posterior circulation TIA would be potentially on the differential)  Recommendations:  -MRI brain -MRI cervical spine  -If MRI cervical spine is positive for acute compressive process please discussed with neurosurgery  -If MRI brain is positive for acute stroke, OR if both MRI C-spine and MRI brain are negative Admit for full TIA/stroke workup including A1c, lipid panel, echocardiogram, utilizing stroke admission order set Load with aspirin 325 mg once, Plavix 300 mg once followed by aspirin 81 mg daily and Plavix 75 mg daily after swallow screen completed Permissive hypertension should be continued only if MRI brain is positive for stroke;  PT/OT/SLP consult will only be needed if patient continues to have symptoms  -Please reactivate code stroke if patient has any substantial neurological worsening  Addendum: MRI brain reviewed, agree with radiology. DAPT ordered, stroke order set placed. Stroke team to follow at [facility], will need admit to medicine   This patient is receiving care for possible acute neurological changes. There was 50 minutes of care by this provider at the time of service, including time for direct evaluation via telemedicine, review of medical records, imaging studies and discussion of findings with providers, the patient and/or family.   Brooke Dare MD-PhD [facility] [phone]   If 8pm-8am, please page neurology on call as listed in AMION.  CRITICAL CARE Performed by: Gordy Councilman   Total critical care time: 50 minutes  Critical care time was exclusive of separately billable procedures and treating other patients.  Critical care was necessary to treat or prevent imminent or life-threatening deterioration.  Critical care was time spent personally by me on the following activities: development of treatment plan with patient and/or surrogate as well as nursing, discussions with consultants, evaluation of patient's response to treatment, examination of patient, obtaining history from patient or surrogate, ordering and performing treatments and interventions, ordering and review of laboratory studies, ordering and review of radiographic studies, pulse oximetry and re-evaluation of patient's condition.

## 2023-09-07 NOTE — ED Notes (Signed)
[facility] at the bedside °

## 2023-09-07 NOTE — ED Notes (Signed)
Called to [employer]

## 2023-09-08 ENCOUNTER — Other Ambulatory Visit (HOSPITAL_COMMUNITY): Payer: Self-pay

## 2023-09-08 ENCOUNTER — Inpatient Hospital Stay (HOSPITAL_COMMUNITY): Payer: BC Managed Care – PPO

## 2023-09-08 DIAGNOSIS — I639 Cerebral infarction, unspecified: Secondary | ICD-10-CM | POA: Diagnosis not present

## 2023-09-08 DIAGNOSIS — I6389 Other cerebral infarction: Secondary | ICD-10-CM | POA: Diagnosis not present

## 2023-09-08 LAB — LIPID PANEL
Cholesterol: 250 mg/dL — ABNORMAL HIGH (ref 0–200)
HDL: 44 mg/dL (ref >40)
LDL Cholesterol: 160 mg/dL — ABNORMAL HIGH (ref 0–99)
Total CHOL/HDL Ratio: 5.7 {ratio}
Triglycerides: 232 mg/dL — ABNORMAL HIGH (ref <150)
VLDL: 46 mg/dL — ABNORMAL HIGH (ref 0–40)

## 2023-09-08 LAB — GLUCOSE, CAPILLARY
Glucose-Capillary: 205 mg/dL — ABNORMAL HIGH (ref 70–99)
Glucose-Capillary: 251 mg/dL — ABNORMAL HIGH (ref 70–99)
Glucose-Capillary: 125 mg/dL — ABNORMAL HIGH (ref 70–99)
Glucose-Capillary: 132 mg/dL — ABNORMAL HIGH (ref 70–99)

## 2023-09-08 LAB — ECHOCARDIOGRAM COMPLETE
Area-P 1/2: 3.27 cm2
Calc EF: 56 %
Height: 69 in
S' Lateral: 2.9 cm
Single Plane A2C EF: 54.1 %
Single Plane A4C EF: 57 %
Weight: 3904 [oz_av]

## 2023-09-08 LAB — HIV ANTIBODY (ROUTINE TESTING W REFLEX): HIV Screen 4th Generation wRfx: NONREACTIVE

## 2023-09-08 MED ORDER — ATORVASTATIN CALCIUM 40 MG PO TABS: 40.0000 mg | ORAL_TABLET | Freq: Every day | ORAL | Status: DC

## 2023-09-08 MED ORDER — CLOPIDOGREL BISULFATE 75 MG PO TABS
75.0000 mg | ORAL_TABLET | Freq: Every day | ORAL | 0 refills | Status: AC
  Filled 2023-09-08: qty 21, 21d supply, fill #0

## 2023-09-08 MED ORDER — GLIPIZIDE ER 5 MG PO TB24
ORAL_TABLET | ORAL | 0 refills | Status: DC
  Filled 2023-09-08: qty 90, 30d supply, fill #0

## 2023-09-08 MED ORDER — GLIPIZIDE ER 5 MG PO TB24
15.0000 mg | ORAL_TABLET | Freq: Every day | ORAL | 0 refills | Status: DC
  Filled 2023-09-08: qty 90, 30d supply, fill #0

## 2023-09-08 MED ORDER — LIVING WELL WITH DIABETES BOOK
Freq: Once | Status: AC
  Filled 2023-09-08 (×2): qty 1

## 2023-09-08 MED ORDER — ATORVASTATIN CALCIUM 80 MG PO TABS
80.0000 mg | ORAL_TABLET | Freq: Every day | ORAL | Status: DC
  Administered 2023-09-08: 80 mg via ORAL
  Filled 2023-09-08: qty 1

## 2023-09-08 MED ORDER — ASPIRIN 81 MG PO TBEC
81.0000 mg | DELAYED_RELEASE_TABLET | Freq: Every day | ORAL | 12 refills | Status: DC
  Filled 2023-09-08: qty 30, 30d supply, fill #0

## 2023-09-08 MED ORDER — ATORVASTATIN CALCIUM 80 MG PO TABS
80.0000 mg | ORAL_TABLET | Freq: Every day | ORAL | 1 refills | Status: DC
  Filled 2023-09-08: qty 30, 30d supply, fill #0

## 2023-09-08 MED ORDER — OZEMPIC (0.25 OR 0.5 MG/DOSE) 2 MG/1.5ML ~~LOC~~ SOPN: 0.2500 mg | PEN_INJECTOR | SUBCUTANEOUS | Status: DC

## 2023-09-08 NOTE — Evaluation (Signed)
Physical Therapy Evaluation Patient Details Name: Jack Carney MRN: [id number] DOB: 06/05/1971 Today's Date: 09/08/2023  History of Present Illness  52 y.o. male presented to ED 11/14 with dizziness after sneezing. Also experiencing disorientation, fatigue, numbness in B hands and RLE weakness. 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  MRI of the cervical spine showing multilevel cervical spondylosis, worst at C3-C4, where there is severe left neuroforaminal narrowing.  Moderate to severe right neuroforaminal narrowing C5-C6. 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 Pt lives with significant other in single story home with steps to enter. Pt is limited in safe mobility by  decreased balance associated to poor proprioception and mild new weakness in R LE, in presence of long standing sciatic L LE pain and subacute paresthesia in R UE. Pt is mod I for bed mobility and contact guard assist for mild instability with transfers, ambulation and stairs. PT recommending neuro OP PT services at discharge for both balance and to address paresthesia in  R UE. PT will continue to follow acutely.       If plan is discharge home, recommend the following: A little help with walking and/or transfers;A little help with bathing/dressing/bathroom;Assistance with cooking/housework;Assist for transportation   Can travel by private vehicle    Yes    Equipment Recommendations None recommended by PT     Functional Status Assessment Patient has had a recent decline in their functional status and demonstrates the ability to make significant improvements in function in a reasonable and predictable  amount of time.     Precautions / Restrictions Precautions Precautions: Fall Precaution Comments: hx of falls prior to admission related to CVA symptoms Restrictions Weight Bearing Restrictions: No      Mobility  Bed Mobility Overal bed mobility: Modified Independent             General bed mobility comments: minor use of bed rail to pull to EoB    Transfers Overall transfer level: Needs assistance Equipment used: None, Rolling walker (2 wheels) Transfers: Sit to/from Stand Sit to Stand: Contact guard assist           General transfer comment: slight unsteadiness with initial stand able to self steady at walker    Ambulation/Gait Ambulation/Gait assistance: Contact guard assist Gait Distance (Feet): 150 Feet Assistive device: Rolling walker (2 wheels), None Gait Pattern/deviations: Step-through pattern, Decreased weight shift to right, Drifts right/left Gait velocity: slowed Gait velocity interpretation: <1.8 ft/sec, indicate of risk for recurrent falls   General Gait Details: contact guard for safety ambulated with RW initially however pt reports it is not helpful, gait is not significantly different without AD. Pt with decreased proprioception on R with need to increase force through R foot in order to "feel" floor.  Stairs Stairs: Yes Stairs assistance: Contact guard assist           Modified Rankin (Stroke Patients Only) Modified Rankin (Stroke Patients Only) Pre-Morbid Rankin Score: No symptoms Modified Rankin: No significant disability     Balance Overall balance assessment: Needs assistance Sitting-balance support: Feet supported, No upper extremity supported Sitting balance-Leahy Scale: Good     Standing balance support: During functional activity, No upper extremity supported Standing balance-Leahy Scale: Fair  High level balance activites: Head turns, Sudden stops, Direction changes High Level Balance Comments:  balance deficits seem to be more related to decreased R LE proprioception and compesatory strategies for "feeling" ground             Pertinent Vitals/Pain Pain Assessment Pain Assessment: 0-10 Pain Score: 7  Pain Location: L sciatica Pain Descriptors / Indicators: Throbbing Pain Intervention(s): Limited activity within patient's tolerance, Monitored during session, Repositioned    Home Living Family/patient expects to be discharged to:: Private residence Living Arrangements: Spouse/significant other   Type of Home: House Home Access: Stairs to enter Entrance Stairs-Rails: None Entrance Stairs-Number of Steps: 5   Home Layout: One level Home Equipment: Cane - single point;Hand held shower head      Prior Function Prior Level of Function : Independent/Modified Independent             Mobility Comments: driving, drives parts trunk for [employer]       Extremity/Trunk Assessment   Upper Extremity Assessment Upper Extremity Assessment: Defer to OT evaluation    Lower Extremity Assessment Lower Extremity Assessment: LLE deficits/detail;Generalized weakness;RLE deficits/detail RLE Deficits / Details: ROM and strength WFL, but weaker than L RLE Sensation: decreased light touch;decreased proprioception LLE Deficits / Details: L LE sciatica pain with laying in bed too long, strength and ROM WFL    Cervical / Trunk Assessment Cervical / Trunk Assessment: Kyphotic  Communication   Communication Communication: No apparent difficulties  Cognition Arousal: Alert Behavior During Therapy: WFL for tasks assessed/performed Overall Cognitive Status: Within Functional Limits for tasks assessed                                          General Comments General comments (skin integrity, edema, etc.): Pt and sig other provided education on BE FAST symptomology, pt questions answered, pt with numbness and tingling in L UE possibly due to cervical stenosis         Assessment/Plan    PT Assessment Patient needs continued PT services  PT Problem List Decreased strength;Decreased balance;Impaired sensation;Pain       PT Treatment Interventions Gait training;Stair training;Functional mobility training;Therapeutic activities;Therapeutic exercise;Balance training;Neuromuscular re-education;Patient/family education    PT Goals (Current goals can be found in the Care Plan section)  Acute Rehab PT Goals PT Goal Formulation: With patient/family Time For Goal Achievement: 09/22/23 Potential to Achieve Goals: Good    Frequency Min 1X/week        AM-PAC PT "6 Clicks" Mobility  Outcome Measure Help needed turning from your back to your side while in a flat bed without using bedrails?: None Help needed moving from lying on your back to sitting on the side of a flat bed without using bedrails?: None Help needed moving to and from a bed to a chair (including a wheelchair)?: A Little Help needed standing up from a chair using your arms (e.g., wheelchair or bedside chair)?: A Little Help needed to walk in hospital room?: A Little Help needed climbing 3-5 steps with a railing? : A Little 6 Click Score: 20    End of Session Equipment Utilized During Treatment: Gait belt Activity Tolerance: Patient tolerated treatment well Patient left: in chair;with call bell/phone within reach;with family/visitor present Nurse Communication: Mobility status;Other (comment) (sitting in straight back chair with permission to transfer from bed to chair to manage L LE sciatica) PT Visit Diagnosis: Other  abnormalities of gait and mobility (R26.89);Unsteadiness on feet (R26.81);Difficulty in walking, not elsewhere classified (R26.2);Other symptoms and signs involving the nervous system (R29.898);Pain Pain - Right/Left: Left Pain - part of body: Leg;Hip    Time: 1610-9604 PT Time Calculation (min) (ACUTE ONLY): 34 min   Charges:   PT Evaluation $PT Eval Moderate  Complexity: 1 Mod PT Treatments $Gait Training: 8-22 mins PT General Charges $$ ACUTE PT VISIT: 1 Visit         Zaria Taha B. Beverely Risen PT, DPT Acute Rehabilitation Services Please use secure chat or  Call Office [phone]   [facility] 09/08/2023, 11:41 AM

## 2023-09-08 NOTE — Progress Notes (Signed)
  Echocardiogram 2D Echocardiogram has been performed.  Milda Smart 09/08/2023, 11:11 AM

## 2023-09-08 NOTE — Inpatient Diabetes Management (Addendum)
Inpatient Diabetes Program Recommendations  AACE/ADA: New Consensus Statement on Inpatient Glycemic Control (2015)  Target Ranges:  Prepandial:   less than 140 mg/dL      Peak postprandial:   less than 180 mg/dL (1-2 hours)      Critically ill patients:  140 - 180 mg/dL   Lab Results  Component Value Date   GLUCAP 251 (H) 09/08/2023   HGBA1C 9.7 (H) 09/07/2023    Review of Glycemic Control  Diabetes history: DM2 Outpatient Diabetes medications: Glucotrol 10 mg daily (not taking regularly), Ozempic (stopped taking 4 months ago after taking 6 months) Current orders for Inpatient glycemic control: Glucotrol 10 mg, Novolog 0-20 units tid  Inpatient Diabetes Program Recommendations:   Spoke with patient via phone (DM coordinator not working @ [facility] facility) Patient took himself off of Ozempic 4 months ago due to "stomach bothering" him. Patient noted that his stomach bothered him when he ate high greasy food. Reviewed with patient these foods are not advised while taking Ozempic and also elevated blood glucose. Reviewed A1c of 9.7 (average blood glucose over the past 2-3 months 232). Ordered Living Well With Diabetes for patient review. Patient wishes to restart Ozempic and change diet while checking CBGs regularly. Patient is not on insulin so a CGM was not approved in the past from his PCP trying to get for him.  Thank you, Jack Carney. Jack Porzio, RN, MSN, CDCES  Diabetes Coordinator Inpatient Glycemic Control Team Team Pager [phone] (8am-5pm) 09/08/2023 1:14 PM

## 2023-09-08 NOTE — Progress Notes (Addendum)
STROKE TEAM PROGRESS NOTE   BRIEF HPI [name] is a 52 y.o. male with history of hypertension, hyperlipidemia, diabetes, obesity, snoring (sleep study negative for sleep apnea), asthma with cough presenting with right hand weakness. He notes that 2 weeks ago he was taking his trash out when he missed the last step on his deck falling first forward onto his knees and then his head with and backwards with him sitting on the step for a moment.  He had quite a bit of knee pain which initially improved over the first week but has been now worsening. 11/14 he had been driving into work when he sneezed and subsequently had bilateral arm tingling, right leg weakness and dizziness, disorientation, fatigue.  He noticed some difficulty when signing his name.  Subtle right hand weakness affecting his handwriting remains and he also feels like he has some leg weakness.  No bowel or bladder symptoms.   Has also been having numbness of the right trap x 1 month, which he mostly notices when he uses 2 pillows, which he has been using due to some shortness of breath laying flat.  He does also have some shortness of breath with exertion which he has been attributing to worsening asthma symptoms in the setting of the fall season.   On full review of systems he does report some stress due to family issues (son traveling in [place], difficult relationship with ex-wife)  NIH on Admission 1  SIGNIFICANT HOSPITAL EVENTS   INTERIM HISTORY/SUBJECTIVE Patient was reporting to work, sneezed and felt "funny" Got out of the car stumbled outside of his pickup truck, several times. Experienced difficulty walking with right leg weakness and bilateral hand numbness. Reported mild dysarthria and difficulty writing. Symptoms have improved this morning. Significant other bedside for support.   Patient fell 2 weeks ago and resulted in bilateral knee pain. Denies compliance with chronic medical condition medications and  understands importance in taking.   OBJECTIVE  CBC    Component Value Date/Time   WBC 5.5 09/07/2023 0804   RBC 5.33 09/07/2023 0804   HGB 15.3 09/07/2023 0811   HCT 45.0 09/07/2023 0811   PLT 217 09/07/2023 0804   MCV 80.5 09/07/2023 0804   MCH 26.5 09/07/2023 0804   MCHC 32.9 09/07/2023 0804   RDW 14.3 09/07/2023 0804   LYMPHSABS 1.3 09/07/2023 0804   MONOABS 0.7 09/07/2023 0804   EOSABS 0.2 09/07/2023 0804   BASOSABS 0.0 09/07/2023 0804    BMET    Component Value Date/Time   NA 139 09/07/2023 0811   K 4.0 09/07/2023 0811   CL 104 09/07/2023 0811   CO2 24 09/07/2023 0804   GLUCOSE 208 (H) 09/07/2023 0811   BUN 14 09/07/2023 0811   CREATININE 1.20 09/07/2023 0811   CALCIUM 9.2 09/07/2023 0804   GFRNONAA >60 09/07/2023 0804    IMAGING past 24 hours MR BRAIN WO CONTRAST  Result Date: 09/07/2023 CLINICAL DATA:  Neuro deficit, acute, stroke suspected; Myelopathy, acute, cervical spine. EXAM: MRI HEAD WITHOUT CONTRAST MRI CERVICAL SPINE WITHOUT CONTRAST TECHNIQUE: Multiplanar, multiecho pulse sequences of the brain and surrounding structures, and cervical spine, to include the craniocervical junction and cervicothoracic junction, were obtained without intravenous contrast. COMPARISON:  Head CT and CTA head/neck 09/07/2023. 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 No acute hemorrhage or significant mass effect. No hydrocephalus or extra-axial collection. No foci of  abnormal susceptibility. No mass or midline shift. Vascular: Normal flow voids. Skull and upper cervical spine: Normal marrow signal. Sinuses/Orbits: Mild mucosal disease in the right maxillary sinus. Orbits are unremarkable. Other: None. MRI CERVICAL SPINE FINDINGS Alignment: Normal. Vertebrae: No fracture, evidence of discitis, or bone lesion. Cord: Normal spinal cord signal and volume. Posterior Fossa,  vertebral arteries, paraspinal tissues: Hypoplastic left vertebral artery. Otherwise unremarkable. Disc levels: C2-C3: Right-greater-than-left facet arthropathy and uncovertebral joint spurring results in moderate right neural foraminal narrowing. C3-C4: Small disc bulge without spinal canal stenosis. Left-greater-than-right facet arthropathy and uncovertebral joint spurring results in severe left neural foraminal narrowing. C4-C5: Right-greater-than-left facet arthropathy and uncovertebral joint spurring results in moderate right neural foraminal narrowing. C5-C6: Small disc bulge without spinal canal stenosis. Right-greater-than-left facet arthropathy and uncovertebral joint spurring results in moderate-to-severe right neural foraminal narrowing. C6-C7: Small disc bulge without spinal canal stenosis or neural foraminal narrowing. C7-T1:  Normal. IMPRESSION: 1. 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 No acute hemorrhage or significant mass effect. 2. Multilevel cervical spondylosis, worst at C3-C4, where there is severe left neural foraminal narrowing. 3. Moderate-to-severe right neural foraminal narrowing at C5-C6. Electronically Signed   By: Orvan Falconer M.D.   On: 09/07/2023 11:14   MR CERVICAL SPINE WO CONTRAST  Result Date: 09/07/2023 CLINICAL DATA:  Neuro deficit, acute, stroke suspected; Myelopathy, acute, cervical spine. EXAM: MRI HEAD WITHOUT CONTRAST MRI CERVICAL SPINE WITHOUT CONTRAST TECHNIQUE: Multiplanar, multiecho pulse sequences of the brain and surrounding structures, and cervical spine, to include the craniocervical junction and cervicothoracic junction, were obtained without intravenous contrast. COMPARISON:  Head CT and CTA head/neck 09/07/2023. 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 No  acute hemorrhage or significant mass effect. No hydrocephalus or extra-axial collection. No foci of abnormal susceptibility. No mass or midline shift. Vascular: Normal flow voids. Skull and upper cervical spine: Normal marrow signal. Sinuses/Orbits: Mild mucosal disease in the right maxillary sinus. Orbits are unremarkable. Other: None. MRI CERVICAL SPINE FINDINGS Alignment: Normal. Vertebrae: No fracture, evidence of discitis, or bone lesion. Cord: Normal spinal cord signal and volume. Posterior Fossa, vertebral arteries, paraspinal tissues: Hypoplastic left vertebral artery. Otherwise unremarkable. Disc levels: C2-C3: Right-greater-than-left facet arthropathy and uncovertebral joint spurring results in moderate right neural foraminal narrowing. C3-C4: Small disc bulge without spinal canal stenosis. Left-greater-than-right facet arthropathy and uncovertebral joint spurring results in severe left neural foraminal narrowing. C4-C5: Right-greater-than-left facet arthropathy and uncovertebral joint spurring results in moderate right neural foraminal narrowing. C5-C6: Small disc bulge without spinal canal stenosis. Right-greater-than-left facet arthropathy and uncovertebral joint spurring results in moderate-to-severe right neural foraminal narrowing. C6-C7: Small disc bulge without spinal canal stenosis or neural foraminal narrowing. C7-T1:  Normal. IMPRESSION: 1. 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 No acute hemorrhage or significant mass effect. 2. Multilevel cervical spondylosis, worst at C3-C4, where there is severe left neural foraminal narrowing. 3. Moderate-to-severe right neural foraminal narrowing at C5-C6. Electronically Signed   By: Orvan Falconer M.D.   On: 09/07/2023 11:14    Vitals:   09/07/23 2051 09/08/23 0017 09/08/23 0336 09/08/23 0747  BP: (!) 142/93 134/86 (!) 149/98 (!) 150/93  Pulse: 83 76 83 75  Resp: 18  16 18 17   Temp: 98.7 F (37.1 C) 98.1 F (36.7  C) 97.8 F (36.6 C) 99.9 F (37.7 C)  TempSrc: Oral Oral Oral Oral  SpO2: 100% 99% 98% 99%  Weight:      Height:         PHYSICAL EXAM General:  Alert, well-nourished, well-developed patient in no acute distress Psych:  Mood and affect appropriate for situation CV: Regular rate and rhythm on monitor Respiratory:  Regular, unlabored respirations on room air  NEURO:  Mental Status: AA&Ox3, patient is able to give clear and coherent history Speech/Language: speech is without dysarthria or aphasia.    Cranial Nerves:  II: PERRL. Visual fields full.  III, IV, VI: EOMI. Eyelids elevate symmetrically.  V: Sensation is intact to light touch and symmetrical to face.  VII:  Mild R facial droop  VIII: hearing intact to voice. IX, X: Palate elevates symmetrically. Phonation is normal.  XL:[id number] shrug 4/5 on right side  XII: tongue is midline without fasciculations. Motor: 4/5 strength on RUE. 5/5 on all other muscle groups tested   Tone: is normal and bulk is normal Sensation- Intact to light touch bilaterally. Extinction absent to light touch to DSS.   Coordination: FTN intact bilaterally, HKS: no ataxia in BLE.No drift.  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 No acute hemorrhage or significant mass effect. 2D Echo EF 55-60%, no intracardiac source of embolism,unremarkable   LDL 160 HgbA1c 9.7 UDS negative VTE prophylaxis - Lovenox  No antithrombotic prior to admission, now on aspirin 81 mg daily and clopidogrel 75 mg daily for 3 weeks and then ASA alone. Therapy recommendations:   Outpatient PT and OT  Disposition: Home   Hypertension Home meds: Amlodipine 5 mg daily  Stable on the high end Long-term BP goal normotensive  Hyperlipidemia Home meds: Lipitor 40 mg daily LDL 160, goal < 70 Increased to Lipitor 80 mg daily  Continue statin at discharge  Diabetes type II Uncontrolled Home meds:  Glipizide 10 mg daily and ozempic once per week home meds  HgbA1c 9.7, goal < 7.0 CBGs Glipizide 10 mg daily SSI Recommend close follow-up with PCP for better DM control  Other Stroke Risk Factors Obesity, Body mass index is 36.03 kg/m., BMI >/= 30 associated with increased stroke risk, recommend weight loss, diet and exercise as appropriate  Medication non-compliance   Other Active Problems CT C-spine - Multilevel cervical spondylosis, worst at C3-C4, where there is severe left neural foraminal narrowing.  Hospital day # 1  Neurology will sign off. Please call with questions. Pt will follow up with stroke clinic NP at [facility] in about 4 weeks. Thanks for the consult.   Marvel Plan, MD PhD Stroke Neurology 09/08/2023 11:07 PM    To contact Stroke Continuity provider, please refer to [email or web address]. After hours, contact General Neurology

## 2023-09-08 NOTE — Evaluation (Signed)
Occupational Therapy Evaluation Patient Details Name: Jack Carney MRN: [id number] DOB: [birth year] Today's Date: 09/08/2023   History of Present Illness 52 y.o. male presented to ED 11/14 with dizziness after sneezing. Also experiencing disorientation, fatigue, numbness in B hands and RLE weakness. 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  MRI of the cervical spine showing multilevel cervical spondylosis, worst at C3-C4, where there is severe left neuroforaminal narrowing.  Moderate to severe right neuroforaminal narrowing C5-C6. PMH: acute pyelonephritis, nephrolithiasis, ventricular bigeminy, vitamin D deficiency, class II obesity, type 2 diabetes, hyperlipidemia, hypertension, allergic rhinitis, asthma   Clinical Impression   Didier was evaluated s/p the above admission list. He is indep and lives with family at baseline. Upon evaluation the pt was limited by LE weakness and decreased activity tolerance. Overall he needs up to generalized supervision for OOB functional activity and mobility. BUEs are equal in strength and coordination, pt does endorse chronic LUE intermittent paraesthesias (cervical stenosis?). Pt does not have acute OT needs, recommend frequent OOB with nsg or family. Recommend OP neuro OT to progress pt back to indep baseline.        If plan is discharge home, recommend the following: Assist for transportation;Assistance with cooking/housework    Functional Status Assessment  Patient has had a recent decline in their functional status and demonstrates the ability to make significant improvements in function in a reasonable and predictable amount of time.  Equipment Recommendations  None recommended by OT       Precautions / Restrictions Precautions Precautions: Fall Precaution Comments: hx of falls prior to admission related to CVA symptoms Restrictions Weight Bearing  Restrictions: No      Mobility Bed Mobility Overal bed mobility: Modified Independent                  Transfers Overall transfer level: Needs assistance Equipment used: None   Sit to Stand: Supervision                  Balance Overall balance assessment: Needs assistance Sitting-balance support: Feet supported, No upper extremity supported Sitting balance-Leahy Scale: Good       Standing balance-Leahy Scale: Fair                             ADL either performed or assessed with clinical judgement   ADL Overall ADL's : Needs assistance/impaired Eating/Feeding: Independent   Grooming: Modified independent;Standing   Upper Body Bathing: Independent   Lower Body Bathing: Supervison/ safety;Sit to/from stand   Upper Body Dressing : Modified independent   Lower Body Dressing: Supervision/safety;Sit to/from stand   Toilet Transfer: Supervision/safety;Ambulation   Toileting- Clothing Manipulation and Hygiene: Independent       Functional mobility during ADLs: Supervision/safety General ADL Comments: generalized supervision A, no safety concerns or overt LOB. No real acute OT needs adentified.     Vision Baseline Vision/History: 0 No visual deficits Vision Assessment?: No apparent visual deficits     Perception Perception: Within Functional Limits       Praxis Praxis: WFL       Pertinent Vitals/Pain Pain Assessment Pain Assessment: Faces Faces Pain Scale: Hurts little more Pain Location: L sciatica Pain Descriptors / Indicators: Throbbing Pain Intervention(s): Limited activity within patient's tolerance, Monitored during session     Extremity/Trunk Assessment Upper Extremity Assessment Upper Extremity Assessment: Overall WFL for tasks assessed;LUE  deficits/detail (chronic shoulder/trap pain from cervical stenosis) LUE Deficits / Details: reports intermittent paraestesias, chronic.   Lower Extremity Assessment Lower Extremity  Assessment: Defer to PT evaluation   Cervical / Trunk Assessment Cervical / Trunk Assessment: Kyphotic   Communication Communication Communication: No apparent difficulties   Cognition Arousal: Alert Behavior During Therapy: WFL for tasks assessed/performed Overall Cognitive Status: Within Functional Limits for tasks assessed                                       General Comments  VSS     Home Living Family/patient expects to be discharged to:: Private residence Living Arrangements: Spouse/significant other   Type of Home: House Home Access: Stairs to enter Secretary/administrator of Steps: 5 Entrance Stairs-Rails: None Home Layout: One level     Bathroom Shower/Tub: IT trainer: Handicapped height Bathroom Accessibility: Yes   Home Equipment: Cane - single point;Hand held shower head          Prior Functioning/Environment Prior Level of Function : Independent/Modified Independent             Mobility Comments: driving, drives parts trunk for [employer] ADLs Comments: indep, works        OT Problem List: Decreased activity tolerance         OT Goals(Current goals can be found in the care plan section) Acute Rehab OT Goals Patient Stated Goal: home soon OT Goal Formulation: With patient Time For Goal Achievement: 09/08/23 Potential to Achieve Goals: Good   AM-PAC OT "6 Clicks" Daily Activity     Outcome Measure Help from another person eating meals?: None Help from another person taking care of personal grooming?: None Help from another person toileting, which includes using toliet, bedpan, or urinal?: A Little Help from another person bathing (including washing, rinsing, drying)?: A Little Help from another person to put on and taking off regular upper body clothing?: None Help from another person to put on and taking off regular lower body clothing?: A Little 6 Click Score: 21   End of Session Nurse  Communication: Mobility status  Activity Tolerance: Patient tolerated treatment well Patient left: in bed;with call bell/phone within reach  OT Visit Diagnosis: History of falling (Z91.81)                Time: 1610-9604 OT Time Calculation (min): 16 min Charges:  OT Evaluation $OT Eval Low Complexity: 1 Low  Derenda Mis, OTR/L Acute Rehabilitation Services Office [phone] Secure Chat Communication Preferred   Donia Pounds 09/08/2023, 1:14 PM

## 2023-09-08 NOTE — Discharge Summary (Signed)
Physician Discharge Summary  Jack Carney GLO:[id number] DOB: Apr 23, 1971 DOA: 09/07/2023  PCP: Corwin Levins, MD  Admit date: 09/07/2023 Discharge date: 09/08/2023  Time spent: 40 minutes  Recommendations for Outpatient Follow-up:  Follow outpatient CBC/CMP  Follow tolerance/adherence to statin, repeat lipids outpatient, LFT's  Follow blood sugars outpatient - he'll need refills/titration of ozempic and of his glipizide.  Suspect he'll need Nicolena Schurman 3rd medication at least.  Follow blood sugars closely outpatient while on amlodipine, titrate and add meds as appropriate Follow cervical spine disease outpatient   Discharge Diagnoses:  Principal Problem:   Acute CVA (cerebrovascular accident) (HCC) Active Problems:   Hyperlipidemia   Asthma   Essential hypertension   Obesity (BMI 35.0-39.9 without comorbidity)   Type 2 diabetes mellitus with hyperglycemia (HCC)   Discharge Condition: stable  Diet recommendation: heart healthy, diabetic  Filed Weights   09/07/23 0743  Weight: 110.7 kg    History of present illness:   Jack Carney is Jack Carney 52 y.o. male with medical history significant of acute pyelonephritis, nephrolithiasis, ventricular bigeminy, vitamin D deficiency, class II obesity, type 2 diabetes, hyperlipidemia, hypertension, allergic rhinitis, asthma who presented to the emergency department complaints of dizziness after sneezing earlier this morning.  Subsequently the dizziness disappeared, but he feels disoriented and fatigued.  He has also noticed that his hands were numb and RLE is weaker than usual.  The numbness in the hands has disappeared but still his RLE was feeling weak at the time of arrival.   He was diagnosed with an acute stroke.  Seen by neurology.  Stable for discharge on 11/15.  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  Normal CTA of neck.   Echo with EF 55-60% LDL 160, A1c 9.7  Appreciate neurology recommendations - suspected due to chronic atherosclerotic disease due to chronic medical conditions and non compliance on medications.  Recommending DAPT x 3 weeks followed by aspirin alone.  Lipitor 80 mg.  Improved control of HTN, DM, HLD.  Outpatient therapy  Dyslipidemia Statin  T2DM Was not previously taking his ozempic due to GI side effects.  Didn't tolerate metformin due to GI side effects. He's willing to try ozempic again after discussing with diabetes coordinator.  Will have him resume this at discharge and will also increase glipizide to 15 mg daily.  He'll need close follow up with titration/adjustment of meds outpatient.   Hypertension Continue amlodipine, will need follow up and adjustment of BP meds as indicated  Obesity Body mass index is 36.03 kg/m.  Cervical Spondylosis Foraminal Narrowing Follow outpatient     Procedures: Echo IMPRESSIONS     1. Left ventricular ejection fraction, by estimation, is 55 to 60%. The  left ventricle has normal function. The left ventricle has no regional  wall motion abnormalities. Left ventricular diastolic parameters are  indeterminate.   2. Right ventricular systolic function is normal. The right ventricular  size is normal. There is normal pulmonary artery systolic pressure. The  estimated right ventricular systolic pressure is 11.1 mmHg.   3. The mitral valve is normal in structure. Trivial mitral valve  regurgitation. No evidence of mitral stenosis.  4. The aortic valve is normal in structure. Aortic valve regurgitation is  not visualized. No aortic stenosis is present.   5. The  inferior vena cava is normal in size with greater than 50%  respiratory variability, suggesting right atrial pressure of 3 mmHg.   Conclusion(s)/Recommendation(s): No intracardiac source of embolism  detected on this transthoracic study. Consider Kielee Care transesophageal  echocardiogram to exclude cardiac source of embolism if clinically  indicated.    Consultations: neurology  Discharge Exam: Vitals:   09/08/23 0747 09/08/23 1115  BP: (!) 150/93 (!) 149/94  Pulse: 75 75  Resp: 17 17  Temp: 99.9 F (37.7 C) (!) 97.5 F (36.4 C)  SpO2: 99% 99%   No complaints  General: No acute distress. Cardiovascular: RRR Lungs: unlabored Neurological: Alert and oriented 3. Moves all extremities 4 with equal strength. Cranial nerves II through XII intact. Extremities: No clubbing or cyanosis. No edema.  Discharge Instructions   Discharge Instructions     Ambulatory referral to Neurology   Complete by: As directed    Patient needs follow-up for right thalamic stroke, can be seen by any provider in 4 weeks   Ambulatory referral to Occupational Therapy   Complete by: As directed    Ambulatory referral to Physical Therapy   Complete by: As directed    Diet - low sodium heart healthy   Complete by: As directed    Discharge instructions   Complete by: As directed    You were seen for Jack Carney stroke.  You've been started on aspirin and plavix.  You'll take these together for 21 days and then take aspirin alone.    Your diabetes, high blood pressure, and high cholesterol need to be closely managed to lower your stroke risk in the future.  We've started you back on lipitor for your cholesterol (it also helps reduce your risk of stroke).    The other things that will be important are better controlling your blood sugars.  Your A1c is currently over 9.  I'll increase your glipizide to 15 mg daily with breakfast (watch for lows with an adjustment in your glipizide).  You're also going to resume your  ozempic.  I have Domonic Hiscox feeling you'll probably need another medicine (or further adjustment in your ozempic and/or glipizide, so follow up closely with your outpatient PCP for further recommendations).  Your blood pressure will need to be managed closely (just like your cholesterol and diabetes).  Resume your amlodipine at discharge.  Follow up with your PCP for further adjustment of your blood pressure medicines.  I suspect you'll need titration of your amlodipine and possibly additional medications.   Return for new, recurrent, or worsening symptoms.   Please ask your PCP to request records from this hospitalization so they know what was done and what the next steps will be.   Increase activity slowly   Complete by: As directed       Allergies as of 09/08/2023       Reactions   Metformin And Related Diarrhea   Sulfonamide Derivatives Nausea And Vomiting   Stomach pains   Penicillins Rash        Medication List     STOP taking these medications    indapamide 1.25 MG tablet Commonly known as: LOZOL       TAKE these medications    albuterol 108 (90 Base) MCG/ACT inhaler Commonly known as: VENTOLIN HFA Inhale 2 puffs into the lungs every 6 (six) hours as needed for wheezing  or shortness of breath.   amLODipine 5 MG tablet Commonly known as: NORVASC TAKE 1 TABLET(5 MG) BY MOUTH DAILY   aspirin EC 81 MG tablet Take 1 tablet (81 mg total) by mouth daily. Swallow whole. Start taking on: September 09, 2023   atorvastatin 80 MG tablet Commonly known as: LIPITOR Take 1 tablet (80 mg total) by mouth daily. Start taking on: September 09, 2023 What changed:  medication strength how much to take   clopidogrel 75 MG tablet Commonly known as: PLAVIX Take 1 tablet (75 mg total) by mouth daily for 21 days. Start taking on: September 09, 2023   glipiZIDE 5 MG 24 hr tablet Commonly known as: GLUCOTROL XL Take 3 tablets (15 mg total) by mouth daily with breakfast. Keep track of  your blood sugars and follow up with your PCP to determine whether you need to further adjust your glipizide.  Watch for low blood sugars. What changed:  medication strength how much to take additional instructions   Ozempic (0.25 or 0.5 MG/DOSE) 2 MG/1.5ML Sopn Generic drug: Semaglutide(0.25 or 0.5MG /DOS) Inject 0.25 mg into the skin once Jack Carney week. Resume your ozempic as previously prescribed.  Follow up with your PCP to discuss adjustment in dose. What changed: additional instructions   tamsulosin 0.4 MG Caps capsule Commonly known as: FLOMAX Take 1 capsule (0.4 mg total) by mouth daily.       Allergies  Allergen Reactions   Metformin And Related Diarrhea   Sulfonamide Derivatives Nausea And Vomiting    Stomach pains   Penicillins Rash    Follow-up Information     [facility] at [facility]. Schedule an appointment as soon as possible for Jack Carney visit.   Specialty: Rehabilitation Contact information: [address] [place] [address] [phone]        Corwin Levins, MD Follow up.   Specialties: Internal Medicine, Radiology Contact information: [address] [place] [address] [phone]         [facility] Follow up.   Contact information: [address]     [address] [place] [address] [phone]                 The results of significant diagnostics from this hospitalization (including imaging, microbiology, ancillary and laboratory) are listed below for reference.    Significant Diagnostic Studies: ECHOCARDIOGRAM COMPLETE  Result Date: 09/08/2023    ECHOCARDIOGRAM REPORT   Patient Name:   Jack Carney Date of Exam: 09/08/2023 Medical Rec #:  [id number]       Height:       69.0 in Accession #:    [id number]      Weight:       244.0 lb Date of Birth:  05/17/1971       BSA:          2.248 m Patient Age:    52 years        BP:           150/93 mmHg  Patient Gender: M               HR:           61 bpm. Exam Location:  Inpatient Procedure: 2D Echo, Color Doppler and Cardiac Doppler Indications:    Stroke  History:        Patient has no prior history of Echocardiogram examinations.  Signs/Symptoms:Dizziness/Lightheadedness; Risk                 Factors:Dyslipidemia, Obesity, Hypertension and Diabetes.                 Vitamin D deficiency.  Sonographer:    Milda Smart Referring Phys: [id number] SRISHTI L BHAGAT  Sonographer Comments: Image acquisition challenging due to patient body habitus and Image acquisition challenging due to respiratory motion. IMPRESSIONS  1. Left ventricular ejection fraction, by estimation, is 55 to 60%. The left ventricle has normal function. The left ventricle has no regional wall motion abnormalities. Left ventricular diastolic parameters are indeterminate.  2. Right ventricular systolic function is normal. The right ventricular size is normal. There is normal pulmonary artery systolic pressure. The estimated right ventricular systolic pressure is 11.1 mmHg.  3. The mitral valve is normal in structure. Trivial mitral valve regurgitation. No evidence of mitral stenosis.  4. The aortic valve is normal in structure. Aortic valve regurgitation is not visualized. No aortic stenosis is present.  5. The inferior vena cava is normal in size with greater than 50% respiratory variability, suggesting right atrial pressure of 3 mmHg. Conclusion(s)/Recommendation(s): No intracardiac source of embolism detected on this transthoracic study. Consider Dillen Belmontes transesophageal echocardiogram to exclude cardiac source of embolism if clinically indicated. FINDINGS  Left Ventricle: Left ventricular ejection fraction, by estimation, is 55 to 60%. The left ventricle has normal function. The left ventricle has no regional wall motion abnormalities. The left ventricular internal cavity size was normal in size. There is  no left ventricular  hypertrophy. Left ventricular diastolic parameters are indeterminate. Normal left ventricular filling pressure. Right Ventricle: The right ventricular size is normal. No increase in right ventricular wall thickness. Right ventricular systolic function is normal. There is normal pulmonary artery systolic pressure. The tricuspid regurgitant velocity is 1.42 m/s, and  with an assumed right atrial pressure of 3 mmHg, the estimated right ventricular systolic pressure is 11.1 mmHg. Left Atrium: Left atrial size was not well visualized. Right Atrium: Right atrial size was not well visualized. Pericardium: There is no evidence of pericardial effusion. Mitral Valve: The mitral valve is normal in structure. Trivial mitral valve regurgitation. No evidence of mitral valve stenosis. Tricuspid Valve: The tricuspid valve is normal in structure. Tricuspid valve regurgitation is trivial. No evidence of tricuspid stenosis. Aortic Valve: The aortic valve is normal in structure. Aortic valve regurgitation is not visualized. No aortic stenosis is present. Pulmonic Valve: The pulmonic valve was normal in structure. Pulmonic valve regurgitation is not visualized. No evidence of pulmonic stenosis. Aorta: The aortic root is normal in size and structure. Venous: The inferior vena cava is normal in size with greater than 50% respiratory variability, suggesting right atrial pressure of 3 mmHg. IAS/Shunts: No atrial level shunt detected by color flow Doppler.  LEFT VENTRICLE PLAX 2D LVIDd:         4.10 cm     Diastology LVIDs:         2.90 cm     LV e' medial:    6.53 cm/s LV PW:         1.00 cm     LV E/e' medial:  9.9 LV IVS:        1.00 cm     LV e' lateral:   9.57 cm/s LVOT diam:     2.10 cm     LV E/e' lateral: 6.7 LV SV:         67 LV SV Index:  30 LVOT Area:     3.46 cm  LV Volumes (MOD) LV vol d, MOD A2C: 72.4 ml LV vol d, MOD A4C: 87.7 ml LV vol s, MOD A2C: 33.2 ml LV vol s, MOD A4C: 37.7 ml LV SV MOD A2C:     39.2 ml LV SV MOD A4C:      87.7 ml LV SV MOD BP:      45.4 ml RIGHT VENTRICLE             IVC RV Basal diam:  3.20 cm     IVC diam: 1.40 cm RV S prime:     10.30 cm/s LEFT ATRIUM             Index        RIGHT ATRIUM           Index LA diam:        2.80 cm 1.25 cm/m   RA Area:     10.90 cm LA Vol (A2C):   45.5 ml 20.24 ml/m  RA Volume:   23.40 ml  10.41 ml/m LA Vol (A4C):   49.1 ml 21.84 ml/m LA Biplane Vol: 48.4 ml 21.53 ml/m  AORTIC VALVE LVOT Vmax:   101.00 cm/s LVOT Vmean:  77.200 cm/s LVOT VTI:    0.194 m  AORTA Ao Root diam: 3.10 cm Ao Asc diam:  3.40 cm MITRAL VALVE               TRICUSPID VALVE MV Area (PHT): 3.27 cm    TR Peak grad:   8.1 mmHg MV Decel Time: 232 msec    TR Vmax:        142.00 cm/s MV E velocity: 64.50 cm/s MV Apollos Tenbrink velocity: 69.10 cm/s  SHUNTS MV E/Toya Palacios ratio:  0.93        Systemic VTI:  0.19 m                            Systemic Diam: 2.10 cm Armanda Magic MD Electronically signed by Armanda Magic MD Signature Date/Time: 09/08/2023/11:36:11 AM    Final    MR BRAIN WO CONTRAST  Result Date: 09/07/2023 CLINICAL DATA:  Neuro deficit, acute, stroke suspected; Myelopathy, acute, cervical spine. EXAM: MRI HEAD WITHOUT CONTRAST MRI CERVICAL SPINE WITHOUT CONTRAST TECHNIQUE: Multiplanar, multiecho pulse sequences of the brain and surrounding structures, and cervical spine, to include the craniocervical junction and cervicothoracic junction, were obtained without intravenous contrast. COMPARISON:  Head CT and CTA head/neck 09/07/2023. 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 No acute hemorrhage or significant mass effect. No hydrocephalus or extra-axial collection. No foci of abnormal susceptibility. No mass or midline shift. Vascular: Normal flow voids. Skull and upper cervical spine: Normal marrow signal. Sinuses/Orbits: Mild mucosal disease in the right maxillary sinus. Orbits are unremarkable. Other:  None. MRI CERVICAL SPINE FINDINGS Alignment: Normal. Vertebrae: No fracture, evidence of discitis, or bone lesion. Cord: Normal spinal cord signal and volume. Posterior Fossa, vertebral arteries, paraspinal tissues: Hypoplastic left vertebral artery. Otherwise unremarkable. Disc levels: C2-C3: Right-greater-than-left facet arthropathy and uncovertebral joint spurring results in moderate right neural foraminal narrowing. C3-C4: Small disc bulge without spinal canal stenosis. Left-greater-than-right facet arthropathy and uncovertebral joint spurring results in severe left neural foraminal narrowing. C4-C5: Right-greater-than-left facet arthropathy and uncovertebral joint spurring results in moderate right neural foraminal narrowing. C5-C6: Small disc bulge  without spinal canal stenosis. Right-greater-than-left facet arthropathy and uncovertebral joint spurring results in moderate-to-severe right neural foraminal narrowing. C6-C7: Small disc bulge without spinal canal stenosis or neural foraminal narrowing. C7-T1:  Normal. IMPRESSION: 1. 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 No acute hemorrhage or significant mass effect. 2. Multilevel cervical spondylosis, worst at C3-C4, where there is severe left neural foraminal narrowing. 3. Moderate-to-severe right neural foraminal narrowing at C5-C6. Electronically Signed   By: Orvan Falconer M.D.   On: 09/07/2023 11:14   MR CERVICAL SPINE WO CONTRAST  Result Date: 09/07/2023 CLINICAL DATA:  Neuro deficit, acute, stroke suspected; Myelopathy, acute, cervical spine. EXAM: MRI HEAD WITHOUT CONTRAST MRI CERVICAL SPINE WITHOUT CONTRAST TECHNIQUE: Multiplanar, multiecho pulse sequences of the brain and surrounding structures, and cervical spine, to include the craniocervical junction and cervicothoracic junction, were obtained without intravenous contrast. COMPARISON:  Head CT and CTA head/neck  09/07/2023. 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 No acute hemorrhage or significant mass effect. No hydrocephalus or extra-axial collection. No foci of abnormal susceptibility. No mass or midline shift. Vascular: Normal flow voids. Skull and upper cervical spine: Normal marrow signal. Sinuses/Orbits: Mild mucosal disease in the right maxillary sinus. Orbits are unremarkable. Other: None. MRI CERVICAL SPINE FINDINGS Alignment: Normal. Vertebrae: No fracture, evidence of discitis, or bone lesion. Cord: Normal spinal cord signal and volume. Posterior Fossa, vertebral arteries, paraspinal tissues: Hypoplastic left vertebral artery. Otherwise unremarkable. Disc levels: C2-C3: Right-greater-than-left facet arthropathy and uncovertebral joint spurring results in moderate right neural foraminal narrowing. C3-C4: Small disc bulge without spinal canal stenosis. Left-greater-than-right facet arthropathy and uncovertebral joint spurring results in severe left neural foraminal narrowing. C4-C5: Right-greater-than-left facet arthropathy and uncovertebral joint spurring results in moderate right neural foraminal narrowing. C5-C6: Small disc bulge without spinal canal stenosis. Right-greater-than-left facet arthropathy and uncovertebral joint spurring results in moderate-to-severe right neural foraminal narrowing. C6-C7: Small disc bulge without spinal canal stenosis or neural foraminal narrowing. C7-T1:  Normal. IMPRESSION: 1. 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 No acute hemorrhage or significant mass effect. 2. Multilevel cervical spondylosis, worst at C3-C4, where there is severe left neural foraminal narrowing. 3. Moderate-to-severe right neural foraminal narrowing at C5-C6. Electronically Signed   By: Orvan Falconer M.D.    On: 09/07/2023 11:14   CT ANGIO HEAD NECK W WO CM (CODE STROKE)  Result Date: 09/07/2023 CLINICAL DATA:  Acute onset of dizziness.  Lower extremity weakness. EXAM: CT ANGIOGRAPHY HEAD AND NECK WITH AND WITHOUT CONTRAST TECHNIQUE: Multidetector CT imaging of the head and neck was performed using the standard protocol during bolus administration of intravenous contrast. Multiplanar CT image reconstructions and MIPs were obtained to evaluate the vascular anatomy. Carotid stenosis measurements (when applicable) are obtained utilizing NASCET criteria, using the distal internal carotid diameter as the denominator. RADIATION DOSE REDUCTION: This exam was performed according to the departmental dose-optimization program which includes automated exposure control, adjustment of the mA and/or kV according to patient size and/or use of iterative reconstruction technique. CONTRAST:  75mL OMNIPAQUE IOHEXOL 350 MG/ML SOLN COMPARISON:  CT head without contrast 09/07/2023 FINDINGS: CTA NECK FINDINGS Aortic arch: Cordelle Dahmen 3 vessel arch configuration is present. No significant vascular disease is present at the aortic arch. Great vessel origins are within normal limits. Right carotid  system: The right common carotid artery is within normal limits. The bifurcation is unremarkable. The cervical right ICA is normal. Left carotid system: The left common carotid artery is within normal limits. The bifurcation is within normal limits. The cervical left ICA is normal. Vertebral arteries: The vertebral arteries originate from the subclavian arteries bilaterally without significant stenoses. The left vertebral artery is hypoplastic without focal stenosis in the neck. The right vertebral artery is unremarkable. Skeleton: Mild endplate changes are present at C3-4 without focal stenosis. No focal osseous lesions are present. Other neck: Soft tissues the neck are otherwise unremarkable. Salivary glands are within normal limits. Thyroid is normal.  No significant adenopathy is present. No focal mucosal or submucosal lesions are present. Upper chest: The lung apices are clear. The thoracic inlet is within normal limits. Review of the MIP images confirms the above findings CTA HEAD FINDINGS Anterior circulation: Atherosclerotic calcifications are present within the cavernous internal carotid arteries bilaterally without significant stenoses. The ICA termini are within normal limits. The right A1 is dominant. A1 and M1 segments are normal. The anterior communicating artery is patent. The MCA bifurcations are within normal limits bilaterally. The ACA and MCA branch vessels are normal bilaterally. No aneurysm is present. Posterior circulation: The left vertebral artery is hypoplastic essentially terminates at the PICA. Right PICA origin is visualized and normal. The basilar artery is normal. Both posterior cerebral arteries originate from the posterior communicating arteries with endplate from P1 segments bilaterally. The PCA branch vessels are normal bilaterally. No aneurysm is present. Venous sinuses: The dural sinuses are patent. The straight sinus and deep cerebral veins are intact. Cortical veins are within normal limits. No significant vascular malformation is evident. Anatomic variants: Fetal type posterior cerebral arteries bilaterally. Review of the MIP images confirms the above findings IMPRESSION: 1. No significant proximal stenosis, aneurysm, or branch vessel occlusion within the Circle of Willis. 2. Normal variant CTA Circle of Willis without significant proximal stenosis, aneurysm, or branch vessel occlusion. 3. Normal CTA of the neck. Electronically Signed   By: Marin Roberts M.D.   On: 09/07/2023 08:48   CT HEAD CODE STROKE WO CONTRAST`  Result Date: 09/07/2023 CLINICAL DATA:  Code stroke. Right leg weakness. Altered mental status. Last seen normal at 6:30 Zanaiya Calabria.m. EXAM: CT HEAD WITHOUT CONTRAST TECHNIQUE: Contiguous axial images were  obtained from the base of the skull through the vertex without intravenous contrast. RADIATION DOSE REDUCTION: This exam was performed according to the departmental dose-optimization program which includes automated exposure control, adjustment of the mA and/or kV according to patient size and/or use of iterative reconstruction technique. COMPARISON:  CT head without contrast 07/20/2015 FINDINGS: Brain: No acute infarct, hemorrhage, or mass lesion is present. No significant white matter lesions are present. The ventricles are of normal size. No significant extraaxial fluid collection is present. Deep brain nuclei are within normal limits. The brainstem and cerebellum are within normal limits. Midline structures are within normal limits. Vascular: No hyperdense vessel or unexpected calcification. Skull: No significant extracranial soft tissue lesion is present. Calvarium is intact. No focal lytic or blastic lesions are present. Sinuses/Orbits: The paranasal sinuses and mastoid air cells are clear. The globes and orbits are within normal limits. ASPECTS [facility] Stroke Program Early CT Score) - Ganglionic level infarction (caudate, lentiform nuclei, internal capsule, insula, M1-M3 cortex): 3/3 - Supraganglionic infarction (M4-M6 cortex): 3/3 Total score (0-10 with 10 being normal): 10/10 IMPRESSION: 1. Normal head CT. 2. Aspects is 10/10. The above was relayed via  text pager to Dr. Iver Nestle on 09/07/2023 at 08:36am . Electronically Signed   By: Marin Roberts M.D.   On: 09/07/2023 08:39    Microbiology: No results found for this or any previous visit (from the past 240 hour(s)).   Labs: Basic Metabolic Panel: Recent Labs  Lab 09/07/23 0804 09/07/23 0811  NA 135 139  K 3.8 4.0  CL 102 104  CO2 24  --   GLUCOSE 210* 208*  BUN 15 14  CREATININE 1.20 1.20  CALCIUM 9.2  --    Liver Function Tests: Recent Labs  Lab 09/07/23 0804  AST 22  ALT 37  ALKPHOS 77  BILITOT 0.7  PROT 7.8  ALBUMIN 4.1    Recent Labs  Lab 09/07/23 0804  LIPASE 41   No results for input(s): "AMMONIA" in the last 168 hours. CBC: Recent Labs  Lab 09/07/23 0804 09/07/23 0811  WBC 5.5  --   NEUTROABS 3.3  --   HGB 14.1 15.3  HCT 42.9 45.0  MCV 80.5  --   PLT 217  --    Cardiac Enzymes: No results for input(s): "CKTOTAL", "CKMB", "CKMBINDEX", "TROPONINI" in the last 168 hours. BNP: BNP (last 3 results) No results for input(s): "BNP" in the last 8760 hours.  ProBNP (last 3 results) No results for input(s): "PROBNP" in the last 8760 hours.  CBG: Recent Labs  Lab 09/07/23 0802 09/07/23 1747 09/07/23 2137 09/08/23 0618  GLUCAP 209* 245* 205* 251*       Signed:  Lacretia Nicks MD.  [facility] 09/08/2023, 3:22 PM

## 2023-09-08 NOTE — TOC Transition Note (Signed)
Transition of Care [facility]) - CM/SW Discharge Note   Patient Details  Name: KYSEAN WITMER MRN: [id number] Date of Birth: 23-Feb-1971  Transition of Care [facility]) CM/SW Contact:  Kermit Balo, RN Phone Number: 09/08/2023, 2:45 PM   Clinical Narrative:     Patient is discharging home with outpatient therapy through [facility]. Information on the AVS. Pt will call to schedule to first appointment.  Pt has supervision at home.  No DME needs. He has a cane at home.  Pt admits to being non compliant with his medications at home. He states he plans to do much better after dc.  Pt's girlfriend to provide transportation home.   Final next level of care: OP Rehab Barriers to Discharge: No Barriers Identified   Patient Goals and CMS Choice   Choice offered to / list presented to : Patient  Discharge Placement                         Discharge Plan and Services Additional resources added to the After Visit Summary for                                       Social Determinants of Health (SDOH) Interventions SDOH Screenings   Food Insecurity: No Food Insecurity (09/07/2023)  Housing: Low Risk  (09/07/2023)  Transportation Needs: No Transportation Needs (09/07/2023)  Utilities: Not At Risk (09/07/2023)  Depression (PHQ2-9): Low Risk  (10/06/2022)  Tobacco Use: Low Risk  (09/07/2023)     Readmission Risk Interventions     No data to display

## 2023-09-11 ENCOUNTER — Encounter: Payer: Self-pay | Admitting: Internal Medicine

## 2023-09-11 ENCOUNTER — Telehealth: Payer: Self-pay

## 2023-09-11 ENCOUNTER — Ambulatory Visit: Payer: BC Managed Care – PPO | Admitting: Internal Medicine

## 2023-09-11 VITALS — BP 160/98 | HR 90 | Temp 97.9°F | Ht 69.0 in | Wt 241.0 lb

## 2023-09-11 DIAGNOSIS — M5416 Radiculopathy, lumbar region: Secondary | ICD-10-CM | POA: Insufficient documentation

## 2023-09-11 DIAGNOSIS — E7849 Other hyperlipidemia: Secondary | ICD-10-CM

## 2023-09-11 DIAGNOSIS — Z7984 Long term (current) use of oral hypoglycemic drugs: Secondary | ICD-10-CM | POA: Diagnosis not present

## 2023-09-11 DIAGNOSIS — Z7985 Long-term (current) use of injectable non-insulin antidiabetic drugs: Secondary | ICD-10-CM

## 2023-09-11 DIAGNOSIS — Z8673 Personal history of transient ischemic attack (TIA), and cerebral infarction without residual deficits: Secondary | ICD-10-CM | POA: Insufficient documentation

## 2023-09-11 DIAGNOSIS — E1165 Type 2 diabetes mellitus with hyperglycemia: Secondary | ICD-10-CM

## 2023-09-11 DIAGNOSIS — I10 Essential (primary) hypertension: Secondary | ICD-10-CM

## 2023-09-11 MED ORDER — OZEMPIC (0.25 OR 0.5 MG/DOSE) 2 MG/1.5ML ~~LOC~~ SOPN: 0.2500 mg | PEN_INJECTOR | SUBCUTANEOUS | 3 refills | Status: DC

## 2023-09-11 NOTE — Assessment & Plan Note (Signed)
Stable, for f/u stroke clinic dec 16, for plavix and asa x 3 wks, then asa 81 alone, continue lipitor 80 every day, for f/u lipids,, start PT tomorrow

## 2023-09-11 NOTE — Patient Outreach (Signed)
Received a red flag Emmi stroke notification. I have assigned Roshanda Florance, RN to call for follow up and determine if there are any Case Management needs.    Laura Greeson, CBCS, CMAA [facility] Care Management Assistant [facility] Care Management [phone]  

## 2023-09-11 NOTE — Assessment & Plan Note (Signed)
Lab Results  Component Value Date   HGBA1C 9.7 (H) 09/07/2023   Uncontrolled,, pt to continue current medical treatment glucotrol 15 mg every day, restart ozempic o.25 mg weekly, with intention for 5 mg at 1 mo

## 2023-09-11 NOTE — Assessment & Plan Note (Signed)
Lab Results  Component Value Date   LDLCALC 160 (H) 09/08/2023   Severe, tolerating lipitor 80 mg, for f/u lipids in 4 wks

## 2023-09-11 NOTE — Patient Outreach (Signed)
  Emmi Stroke Care Coordination Follow Up  09/11/2023 Name:  Jack Carney MRN:  [id number] DOB:  1971-01-25  Subjective: Jack Carney is a 52 y.o. year old male who is a primary care patient of Corwin Levins, MD An Emmi alert was received on 09/10/23 indicating patient responded to questions: Scheduled a follow-up appointment?. I reached out by phone to follow up on the alert and spoke to Patient. Patient voices he is doing okay since returning home. Reviewed and addressed red alert. Patient confirms that he called this morning to make f/u appts as he was d/c home over the weekend. He has PCP appt this afternoon and neuro appt 10/09/23. He has been set up with outpt therapy and hs appt on 09/12/23. Patient aware that he is not to resume driving until medically cleared. He confirms he has transportation to get to appts. He voices he has supportive girlfriend in the home assisting him. His appetite has been good. Blood sugar this AM fasting was 156. He has BP machine in the home but has not checked BP yet but will do so. Aware to take BP and cbg reading log to MD appts for review. He is up walking and moving around with assistance of cane. Denies any RN CM needs or concerns at this time.   Care Coordination Interventions:  Yes, provided   TOC interventions discussed/reviewed: -stroke mgmt & prevention -Doctor visit discussed/reviewed -PCP -Doctor visits discussed/reviewed-Specialist -Provided Verbal Education: nutrition, DM and HtN mgmt in the home, importance of monitoring cbgs and BP-tracking log and taking readings to MD appts, fall/safety measures  Follow up plan: Advised patient that they would continue to get automated EMMI-Stroke post discharge calls to assess how they are doing following recent hospitalization and will receive a call from a nurse if any of their responses were abnormal. Patient voiced understanding and was appreciative of f/u call.   Encounter Outcome:  Patient Visit  Completed   Antionette Fairy, RN,BSN,CCM RN Care Manager Transitions of Care  [facility]  Direct Phone: [phone] Toll Free: [phone] Fax: [phone]

## 2023-09-11 NOTE — Assessment & Plan Note (Signed)
BP Readings from Last 3 Encounters:  09/11/23 (!) 160/98  09/08/23 (!) 147/110  09/06/22 (!) 144/82   uncontrolled pt to continue medical treatment norvasc 5 every day, declines other change for now

## 2023-09-11 NOTE — Therapy (Unsigned)
OUTPATIENT PHYSICAL THERAPY NEURO EVALUATION   Patient Name: Jack Carney MRN: [id number] DOB:Sep 26, 1971, 52 y.o., male Today's Date: 09/12/2023   PCP: Corwin Levins, MD  REFERRING PROVIDER: Zigmund Daniel., MD  END OF SESSION:  PT End of Session - 09/12/23 1207     Visit Number 1    Date for PT Re-Evaluation 11/21/23    PT Start Time 0845    PT Stop Time 0920    PT Time Calculation (min) 35 min    Activity Tolerance Patient tolerated treatment well    Behavior During Therapy [facility] for tasks assessed/performed            Past Medical History:  Diagnosis Date   ALLERGIC RHINITIS 05/08/2007   Qualifier: Diagnosis of  By: Jonny Ruiz MD, Len Blalock    ASTHMA 05/08/2007   Qualifier: Diagnosis of  By: Jonny Ruiz MD, Len Blalock    HYPERLIPIDEMIA 05/08/2007   Qualifier: Diagnosis of  By: Jonny Ruiz MD, Len Blalock    Past Surgical History:  Procedure Laterality Date   sinus surgury     TONSILLECTOMY     Patient Active Problem List   Diagnosis Date Noted   Left lumbar radiculopathy 09/11/2023   History of completed stroke 09/11/2023   Acute CVA (cerebrovascular accident) (HCC) 09/07/2023   Type 2 diabetes mellitus with hyperglycemia (HCC) 09/07/2023   Obesity (BMI 35.0-39.9 without comorbidity) 03/02/2022   Diabetes (HCC) 03/17/2021   Vitamin D deficiency 12/13/2020   Acute right flank pain 10/16/2019   Other microscopic hematuria 10/16/2019   Kidney stone on right side 10/16/2019   Acute pyelonephritis 12/24/2018   Essential hypertension 12/24/2018   Ventricular bigeminy seen on cardiac monitor    Encounter for well adult exam with abnormal findings 03/07/2013   HYPERSOMNIA 10/16/2007   Hyperlipidemia 05/08/2007   Seasonal and perennial allergic rhinitis 05/08/2007   Asthma 05/08/2007    ONSET DATE: 09/08/23  REFERRING DIAG:  R42 (ICD-10-CM) - Dizziness  I63.9 (ICD-10-CM) - Cerebrovascular accident (CVA), unspecified mechanism (HCC)    THERAPY DIAG:  Cerebrovascular accident  (CVA), unspecified mechanism (HCC)  Other lack of coordination  Unsteadiness on feet  Rationale for Evaluation and Treatment: Rehabilitation  SUBJECTIVE:                                                                                                                                                                                             SUBJECTIVE STATEMENT: Patient reports a CVA on 09/07/23. He stayed overnight. Both hands were numb and had some difficulty with RLE as well as dizziness. Yesterday he got antsy and active at home. He  overdid it and was exhausted. His blood sugar and BP were somewhat high. He still gets dizzy when moving too fast, turning or moving from quadruped to stand. His hands are better, but his R index finger is still numb. R leg is feeling stronger, but not feeling normal. Felt like it would give out up until today. He also has chronic LBP and L LE sciatica. Pt accompanied by: self  PERTINENT HISTORY:  Per acute hospital PT evaluation:  52 y.o. male presented to ED 11/14 with dizziness after sneezing. Also experiencing disorientation, fatigue, numbness in B hands and RLE weakness. 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 MRI of the cervical spine showing multilevel cervical spondylosis, worst at C3-C4, where there is severe left neuroforaminal narrowing. Moderate to severe right neuroforaminal narrowing C5-C6. PMH: acute pyelonephritis, nephrolithiasis, ventricular bigeminy, vitamin D deficiency, class II obesity, type 2 diabetes, hyperlipidemia, hypertension, allergic rhinitis, asthma   PAIN:  Are you having pain? Yes: NPRS scale: 8/10 Pain location: Chronic sciatica on L side Pain description: shooting Aggravating factors: sitting Relieving factors: lying down AS evaluation progressed paitent's sciatic pain increased, unable to sit straight or sit still. The pain impeded  mobility.   PRECAUTIONS: Fall  RED FLAGS: None   WEIGHT BEARING RESTRICTIONS: No  FALLS: Has patient fallen in last 6 months? Yes. Number of falls 1 2 weeks prior to his stroke he was taking out garbage cans in the dark, missed the last step, landing on his knees. His pain improved, but returned the day of his stroke.  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   OBJECTIVE:  Note: Objective measures were completed at Evaluation unless otherwise noted.  DIAGNOSTIC FINDINGS:  MRI with acute infarct in the anterolateral aspect of the L thalamus just anterior to the old L thalamic perforator infarct CTA head/neck without significant proximal stenosis, aneurysm, or branch vessel occlusion within circle of willis, normal variant CTA circle of willis without significant proximal stenosis, aneurysm or branch vessel occlusion.  COGNITION: Overall cognitive status: Within functional limits for tasks assessed   SENSATION: Light touch: Impaired  Finger to nose- slow on R, heel slide on shin WNL. LT impaired in R U and LE, spotty.  COORDINATION: Heel slide WFL B, FTN, slow on R  EDEMA:  N/A  MUSCLE TONE: WNL  POSTURE: rounded shoulders and forward head  LOWER EXTREMITY ROM:   L hip ROM limited due to pain from chronic sciatica, otherwise WNL  LOWER EXTREMITY MMT:  BLE MMT at least 4/5, poor NM control in RLE   BED MOBILITY:  I  TRANSFERS: I STAIRS: Level of Assistance: Modified independence Stair Negotiation Technique: Alternating Pattern  with No Rails Patient reports this ability to climb stairs is new. He has been using step to due to fear of R LE giving Number of Stairs: 5  Height of Stairs: 6  Comments: No  major deviations  GAIT: No major gait deviations noted during ambulation in clinic.   FUNCTIONAL TESTS:  5 times sit to stand: 16.08 using hands on knees to push up Functional gait assessment: 25/30 SLS 13 sec on L due to chronic sciatica, < 6 sec on R  with increased instability.  VISION SCREEN, VESTIBULAR ASSESSMENT: Limited due to time, Eyes were normal in tracking, saccades, ROM. Mildly (+) VOR, (-) VOR cancellation  TODAY'S TREATMENT:                                                                                                                              DATE:  09/12/23 Education    PATIENT EDUCATION: Education details: POC Person educated: Patient Education method: Explanation Education comprehension: verbalized understanding  HOME EXERCISE PROGRAM: TBD  GOALS: Goals reviewed with patient? Yes  SHORT TERM GOALS: Target date: 09/26/23  I with initial HEP Baseline: Goal status: INITIAL LONG TERM GOALS: Target date: 11/21/23  I with final HEP Baseline:  Goal status: INITIAL  2.  Patient will be able to stand on either limb in SLS x at least 30 minutes with slow movement of the opposite extremity without LOB. Baseline:  Goal status: INITIAL  3.  Improve patient's L sided LB and sciatic pain to < 4/10 with all activity Baseline: 8/10 Goal status: INITIAL  4.  Patient will walk at least 800 yards on all terrain, no AD, no unsteadiness, no fatigue. Baseline:  Goal status: INITIAL  5.  Improve FGA score to at least 28/30 Baseline: 25/30 Goal status: INITIAL  ASSESSMENT:  CLINICAL IMPRESSION: Patient is a 52 y.o. who was seen today for physical therapy evaluation and treatment for S/P CVA. He presents with chronic LBP with L sciatica that influenced his mobility assessment as well decreased NM control of RLE, decreased balance, unsteadiness, and difficulty walking. He will benefit form PT to improve his back pain as well as facilitate improved functional  strength and control in his R LE to improve balance and safety and ease of walking.  OBJECTIVE IMPAIRMENTS: Abnormal gait, decreased activity tolerance, decreased balance, decreased cognition, decreased coordination, difficulty walking, decreased ROM, decreased strength, dizziness, improper body mechanics, and pain.   ACTIVITY LIMITATIONS: carrying, lifting, bending, squatting, stairs, and locomotion level  PARTICIPATION LIMITATIONS: meal prep, cleaning, laundry, driving, shopping, community activity, and occupation  PERSONAL FACTORS: Past/current experiences are also affecting patient's functional outcome.   REHAB POTENTIAL: Good  CLINICAL DECISION MAKING: Evolving/moderate complexity  EVALUATION COMPLEXITY: Moderate  PLAN:  PT FREQUENCY: 1x/week  PT DURATION: 10 weeks  PLANNED INTERVENTIONS: 97110-Therapeutic exercises, 97530- Therapeutic activity, [id number]- Neuromuscular re-education, 97535- Self Care, 29562- Manual therapy, [phone]- Gait training, 97014- Electrical stimulation (unattended), Patient/Family education, Balance training, Stair training, Dry Needling, Joint mobilization, Spinal mobilization, Vestibular training, Visual/preceptual remediation/compensation, Cryotherapy, and Moist heat  PLAN FOR NEXT SESSION: Treatment for L sciatic pain, motor control and coordination on R, balance.   Iona Beard, DPT 09/12/2023, 12:24 PM

## 2023-09-11 NOTE — Progress Notes (Signed)
Patient ID: Jack Carney, male   DOB: 23-Oct-1971, 52 y.o.   MRN: [id number]        Chief Complaint: follow up hospn 11/14 - 11/15 with acute CVA with right sided weakness, dm, htn, hld, left lumbar radiculopathy       HPI:  Jack Carney is a 52 y.o. male here overall doing ok, still has some subjective RLE weakness with mild gait change, good compliance with plavix planned 3 wks then asa only, tolerating statin, has f/u with stroke clinic dec 16, to start PT tomorrow, needs lipid lab f/u at 4 wks.  Pt denies chest pain, increased sob or doe, wheezing, orthopnea, PND, increased LE swelling, palpitations, dizziness or syncope.    Pt denies polydipsia, polyuria, and asks to restart ozempic,  BP has been controlled at home per pt but now with 3 days acute onset incidentally left lower back pain with LLE pain, numbness and mild weakness.        Wt Readings from Last 3 Encounters:  09/11/23 241 lb (109.3 kg)  09/07/23 244 lb (110.7 kg)  02/23/23 244 lb (110.7 kg)   BP Readings from Last 3 Encounters:  09/11/23 (!) 160/98  09/08/23 (!) 147/110  09/06/22 (!) 144/82         Past Medical History:  Diagnosis Date   ALLERGIC RHINITIS 05/08/2007   Qualifier: Diagnosis of  By: Jonny Ruiz MD, Len Blalock    ASTHMA 05/08/2007   Qualifier: Diagnosis of  By: Jonny Ruiz MD, Len Blalock    HYPERLIPIDEMIA 05/08/2007   Qualifier: Diagnosis of  By: Jonny Ruiz MD, Len Blalock    Past Surgical History:  Procedure Laterality Date   sinus surgury     TONSILLECTOMY      reports that he has never smoked. He has never used smokeless tobacco. He reports that he does not drink alcohol and does not use drugs. family history includes Hypertension in an other family member. Allergies  Allergen Reactions   Metformin And Related Diarrhea   Sulfonamide Derivatives Nausea And Vomiting    Stomach pains   Penicillins Rash   Current Outpatient Medications on File Prior to Visit  Medication Sig Dispense Refill   albuterol (VENTOLIN HFA) 108  (90 Base) MCG/ACT inhaler Inhale 2 puffs into the lungs every 6 (six) hours as needed for wheezing or shortness of breath. 1 each 5   amLODipine (NORVASC) 5 MG tablet TAKE 1 TABLET(5 MG) BY MOUTH DAILY 90 tablet 3   aspirin EC 81 MG tablet Take 1 tablet (81 mg total) by mouth daily. Swallow whole. 30 tablet 12   atorvastatin (LIPITOR) 80 MG tablet Take 1 tablet (80 mg total) by mouth daily. 30 tablet 1   clopidogrel (PLAVIX) 75 MG tablet Take 1 tablet (75 mg total) by mouth daily for 21 days. 21 tablet 0   glipiZIDE (GLUCOTROL XL) 5 MG 24 hr tablet Take 3 tablets (15 mg total) by mouth daily with breakfast. Keep track of your blood sugars and follow up with your PCP to determine whether you need to further adjust your glipizide.  Watch for low blood sugars. 90 tablet 0   tamsulosin (FLOMAX) 0.4 MG CAPS capsule Take 1 capsule (0.4 mg total) by mouth daily. 90 capsule 3   No current facility-administered medications on file prior to visit.        ROS:  All others reviewed and negative.  Objective        PE:  BP (!) 160/98 (BP Location: Left  Arm, Patient Position: Sitting, Cuff Size: Normal)   Pulse 90   Temp 97.9 F (36.6 C) (Oral)   Ht 5\' 9"  (1.753 m)   Wt 241 lb (109.3 kg)   SpO2 98%   BMI 35.59 kg/m                 Constitutional: Pt appears in NAD               HENT: Head: NCAT.                Right Ear: External ear normal.                 Left Ear: External ear normal.                Eyes: . Pupils are equal, round, and reactive to light. Conjunctivae and EOM are normal               Nose: without d/c or deformity               Neck: Neck supple. Gross normal ROM               Cardiovascular: Normal rate and regular rhythm.                 Pulmonary/Chest: Effort normal and breath sounds without rales or wheezing.                Abd:  Soft, NT, ND, + BS, no organomegaly               Neurological: Pt is alert. At baseline orientation, motor trace RLE weakness and 1+ LLE  weakness               Skin: Skin is warm. No rashes, no other new lesions, LE edema - none               Psychiatric: Pt behavior is normal without agitation   Micro: none  Cardiac tracings I have personally interpreted today:  none  Pertinent Radiological findings (summarize): none   Lab Results  Component Value Date   WBC 5.5 09/07/2023   HGB 15.3 09/07/2023   HCT 45.0 09/07/2023   PLT 217 09/07/2023   GLUCOSE 208 (H) 09/07/2023   CHOL 250 (H) 09/08/2023   TRIG 232 (H) 09/08/2023   HDL 44 09/08/2023   LDLDIRECT 189.0 03/02/2022   LDLCALC 160 (H) 09/08/2023   ALT 37 09/07/2023   AST 22 09/07/2023   NA 139 09/07/2023   K 4.0 09/07/2023   CL 104 09/07/2023   CREATININE 1.20 09/07/2023   BUN 14 09/07/2023   CO2 24 09/07/2023   TSH 1.98 03/02/2022   PSA 2.00 03/02/2022   INR 0.9 09/07/2023   HGBA1C 9.7 (H) 09/07/2023   MICROALBUR 2.1 (H) 03/02/2022   Assessment/Plan:  Jack Carney is a 52 y.o. Black or African American [2] male with  has a past medical history of ALLERGIC RHINITIS (05/08/2007), ASTHMA (05/08/2007), and HYPERLIPIDEMIA (05/08/2007).  Hyperlipidemia Lab Results  Component Value Date   LDLCALC 160 (H) 09/08/2023   Severe, tolerating lipitor 80 mg, for f/u lipids in 4 wks   Essential hypertension BP Readings from Last 3 Encounters:  09/11/23 (!) 160/98  09/08/23 (!) 147/110  09/06/22 (!) 144/82   uncontrolled pt to continue medical treatment norvasc 5 every day, declines other change for now   Diabetes [facility]) Lab Results  Component Value Date   HGBA1C  9.7 (H) 09/07/2023   Uncontrolled,, pt to continue current medical treatment glucotrol 15 mg every day, restart ozempic o.25 mg weekly, with intention for 5 mg at 1 mo   Left lumbar radiculopathy Incidental new onset, for LS spine MRI, and refer NS  History of completed stroke Stable, for f/u stroke clinic dec 16, for plavix and asa x 3 wks, then asa 81 alone, continue lipitor 80 every day,  for f/u lipids,, start PT tomorrow  Followup: Return in about 3 months (around 12/12/2023).  Oliver Barre, MD 09/11/2023 9:44 PM [facility]

## 2023-09-11 NOTE — Assessment & Plan Note (Signed)
Incidental new onset, for LS spine MRI, and refer NS

## 2023-09-11 NOTE — Patient Instructions (Signed)
Please take all new medication as prescribed - the restart for ozempic  Please call in 1 month for higher dose of ozempic if oding well  Please continue all other medications as before, and refills have been done if requested.  Please have the pharmacy call with any other refills you may need.  Please continue your efforts at being more active, low cholesterol diet, and weight control.  You are otherwise up to date with prevention measures today.  Please keep your appointments with your specialists as you may have planned - Dec 16 neurology  You will be contacted regarding the referral for: MRI LS spine, and Neurosurgury  We can hold on further lab today  Please make an Appointment to return in 3 months, or sooner if needed

## 2023-09-12 ENCOUNTER — Encounter: Payer: Self-pay | Admitting: Physical Therapy

## 2023-09-12 ENCOUNTER — Ambulatory Visit: Payer: BC Managed Care – PPO | Attending: Family Medicine | Admitting: Physical Therapy

## 2023-09-12 ENCOUNTER — Telehealth: Payer: Self-pay

## 2023-09-12 ENCOUNTER — Ambulatory Visit: Payer: BC Managed Care – PPO | Admitting: Occupational Therapy

## 2023-09-12 ENCOUNTER — Telehealth: Payer: Self-pay | Admitting: Internal Medicine

## 2023-09-12 DIAGNOSIS — R2681 Unsteadiness on feet: Secondary | ICD-10-CM | POA: Insufficient documentation

## 2023-09-12 DIAGNOSIS — R278 Other lack of coordination: Secondary | ICD-10-CM | POA: Diagnosis present

## 2023-09-12 DIAGNOSIS — R42 Dizziness and giddiness: Secondary | ICD-10-CM | POA: Insufficient documentation

## 2023-09-12 DIAGNOSIS — I639 Cerebral infarction, unspecified: Secondary | ICD-10-CM | POA: Insufficient documentation

## 2023-09-12 NOTE — Telephone Encounter (Signed)
Pt states that his job do not want him to drive until Monday 56.38.75. He mentioned that he was goin to do FMLA but he feel a little better but wanted to know if he could get a doctors note to be out until Monday 11.25.24 sent via mychart if possible. Pt did not  receive a doctors note the day of visit.  Please Advise Thanks.

## 2023-09-12 NOTE — Patient Outreach (Signed)
  [facility] Stroke Care Coordination Follow Up  09/12/2023 Name:  Jack Carney MRN:  [id number] DOB:  02/21/1971     Voicemail received from patient requesting return call. Patient shares that he feels better today than he did yesterday and is getting back to feeling normal. He voices that he needs paperwork from MD advising that he can return to work.  Patient voices he discussed returning to work with provider yesterday during appt but forgot to get paperwork before he left. Advised patient to contact PCP office to request documentation saying he can return to work. He voices he plans to return next week if he continues to progress and feel good. Patient will contact provider. No further RN CM needs or concerns at this time.   Care Coordination Interventions:  Yes, provided   Follow up plan: Advised patient that they would continue to get automated EMMI-Stroke post discharge calls to assess how they are doing following recent hospitalization and will receive a call from a nurse if any of their responses were abnormal. Patient voiced understanding and was appreciative of f/u call.   Encounter Outcome:  Patient Visit Completed

## 2023-09-12 NOTE — Telephone Encounter (Signed)
Ok sure that would be fine - ok for work note as pt requests, thanks

## 2023-09-13 NOTE — Telephone Encounter (Signed)
Note sent Via MyChart.

## 2023-09-14 ENCOUNTER — Telehealth: Payer: Self-pay

## 2023-09-15 ENCOUNTER — Other Ambulatory Visit (HOSPITAL_COMMUNITY): Payer: Self-pay

## 2023-09-15 ENCOUNTER — Telehealth: Payer: Self-pay

## 2023-09-15 NOTE — Telephone Encounter (Signed)
Called and let Pt know

## 2023-09-15 NOTE — Telephone Encounter (Signed)
No restrictions except for common sense things, like he should avoid heavy lifting say over 20-30 lbs, or bending at the waist too many times given his recent lower back pain.

## 2023-09-15 NOTE — Telephone Encounter (Signed)
Pharmacy Patient Advocate Encounter   Received notification from Pt Calls Messages that prior authorization for Ozempic 2mg /19ml is required/requested.   Insurance verification completed.   The patient is insured through [employer] .   Per test claim: PA required; PA submitted to above mentioned insurance via CoverMyMeds Key/confirmation #/EOC [id number] Status is pending

## 2023-09-15 NOTE — Telephone Encounter (Signed)
Pt boss is asking if theres "any restrictions" he can and can not do going back to work Monday.  Please advise.  Thanks

## 2023-09-15 NOTE — Telephone Encounter (Signed)
Pt b

## 2023-09-15 NOTE — Telephone Encounter (Signed)
Pharmacy Patient Advocate Encounter  Received notification from [facility] that Prior Authorization for Ozempic has been APPROVED from 09/15/23 to 09/14/26   PA #/Case ID/Reference #: [id number]

## 2023-09-22 ENCOUNTER — Encounter: Payer: Self-pay | Admitting: Internal Medicine

## 2023-09-26 ENCOUNTER — Encounter: Payer: Self-pay | Admitting: Physical Therapy

## 2023-09-26 ENCOUNTER — Ambulatory Visit: Payer: BC Managed Care – PPO | Attending: Family Medicine | Admitting: Physical Therapy

## 2023-09-26 DIAGNOSIS — R2681 Unsteadiness on feet: Secondary | ICD-10-CM | POA: Diagnosis present

## 2023-09-26 DIAGNOSIS — I639 Cerebral infarction, unspecified: Secondary | ICD-10-CM | POA: Insufficient documentation

## 2023-09-26 DIAGNOSIS — R278 Other lack of coordination: Secondary | ICD-10-CM | POA: Insufficient documentation

## 2023-09-26 NOTE — Therapy (Signed)
OUTPATIENT PHYSICAL THERAPY NEURO TREATMENT   Patient Name: Jack Carney MRN: [id number] DOB:November 06, 1970, 52 y.o., male Today's Date: 09/26/2023   PCP: Corwin Levins, MD  REFERRING PROVIDER: Zigmund Daniel., MD  END OF SESSION:  PT End of Session - 09/26/23 1301     Visit Number 2    Date for PT Re-Evaluation 11/21/23    PT Start Time 1302    PT Stop Time 1342    PT Time Calculation (min) 40 min    Activity Tolerance Patient tolerated treatment well    Behavior During Therapy [facility] for tasks assessed/performed             Past Medical History:  Diagnosis Date   ALLERGIC RHINITIS 05/08/2007   Qualifier: Diagnosis of  By: Jonny Ruiz MD, Len Blalock    ASTHMA 05/08/2007   Qualifier: Diagnosis of  By: Jonny Ruiz MD, Len Blalock    HYPERLIPIDEMIA 05/08/2007   Qualifier: Diagnosis of  By: Jonny Ruiz MD, Len Blalock    Past Surgical History:  Procedure Laterality Date   sinus surgury     TONSILLECTOMY     Patient Active Problem List   Diagnosis Date Noted   Left lumbar radiculopathy 09/11/2023   History of completed stroke 09/11/2023   Acute CVA (cerebrovascular accident) (HCC) 09/07/2023   Type 2 diabetes mellitus with hyperglycemia (HCC) 09/07/2023   Obesity (BMI 35.0-39.9 without comorbidity) 03/02/2022   Diabetes (HCC) 03/17/2021   Vitamin D deficiency 12/13/2020   Acute right flank pain 10/16/2019   Other microscopic hematuria 10/16/2019   Kidney stone on right side 10/16/2019   Acute pyelonephritis 12/24/2018   Essential hypertension 12/24/2018   Ventricular bigeminy seen on cardiac monitor    Encounter for well adult exam with abnormal findings 03/07/2013   HYPERSOMNIA 10/16/2007   Hyperlipidemia 05/08/2007   Seasonal and perennial allergic rhinitis 05/08/2007   Asthma 05/08/2007    ONSET DATE: 09/08/23  REFERRING DIAG:  R42 (ICD-10-CM) - Dizziness  I63.9 (ICD-10-CM) - Cerebrovascular accident (CVA), unspecified mechanism (HCC)    THERAPY DIAG:  Cerebrovascular accident  (CVA), unspecified mechanism (HCC)  Unsteadiness on feet  Other lack of coordination  Rationale for Evaluation and Treatment: Rehabilitation  SUBJECTIVE:                                                                                                                                                                                             SUBJECTIVE STATEMENT:  09/26/23  Since last time I've noticed I'm fumbling a lot and dropping things a lot. Feeling better, but my energy and stamina aren't great. Back is still bothering me,  sciatica is screaming today. Feeling cold and stiff. Knees are still bothering me, the morning I had the stroke they really bothering me. Going to get an MRI on my back coming up. Right leg still is difficult for me, I can trip over anything. Would like to focus on my back pain and sciatica today.     EVAL: Patient reports a CVA on 09/07/23. He stayed overnight. Both hands were numb and had some difficulty with RLE as well as dizziness. Yesterday he got antsy and active at home. He overdid it and was exhausted. His blood sugar and BP were somewhat high. He still gets dizzy when moving too fast, turning or moving from quadruped to stand. His hands are better, but his R index finger is still numb. R leg is feeling stronger, but not feeling normal. Felt like it would give out up until today. He also has chronic LBP and L LE sciatica. Pt accompanied by: self  PERTINENT HISTORY:  Per acute hospital PT evaluation:  52 y.o. male presented to ED 11/14 with dizziness after sneezing. Also experiencing disorientation, fatigue, numbness in B hands and RLE weakness. 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 MRI of the cervical spine showing multilevel cervical spondylosis, worst at C3-C4, where there is severe left neuroforaminal narrowing. Moderate to severe right neuroforaminal narrowing  C5-C6. PMH: acute pyelonephritis, nephrolithiasis, ventricular bigeminy, vitamin D deficiency, class II obesity, type 2 diabetes, hyperlipidemia, hypertension, allergic rhinitis, asthma   PAIN:  Are you having pain? Yes: NPRS scale: back 8.5, knees 7.5/10 Pain location: Chronic sciatica on L side, B knee pain Pain description: sciatic pain, shooting/numb going down leg Aggravating factors: sitting Relieving factors: lying down AS evaluation progressed paitent's sciatic pain increased, unable to sit straight or sit still. The pain impeded mobility.   PRECAUTIONS: Fall  RED FLAGS: None   WEIGHT BEARING RESTRICTIONS: No  FALLS: Has patient fallen in last 6 months? Yes. Number of falls 1 2 weeks prior to his stroke he was taking out garbage cans in the dark, missed the last step, landing on his knees. His pain improved, but returned the day of his stroke.  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   OBJECTIVE:  Note: Objective measures were completed at Evaluation unless otherwise noted.  DIAGNOSTIC FINDINGS:  MRI with acute infarct in the anterolateral aspect of the L thalamus just anterior to the old L thalamic perforator infarct CTA head/neck without significant proximal stenosis, aneurysm, or branch vessel occlusion within circle of willis, normal variant CTA circle of willis without significant proximal stenosis, aneurysm or branch vessel occlusion.  COGNITION: Overall cognitive status: Within functional limits for tasks assessed   SENSATION: Light touch: Impaired  Finger to nose- slow on R, heel slide on shin WNL. LT impaired in R U and LE, spotty.  COORDINATION: Heel slide WFL B, FTN, slow on R  EDEMA:   N/A  MUSCLE TONE: WNL  POSTURE: rounded shoulders and forward head  LOWER EXTREMITY ROM:   L hip ROM limited due to pain from chronic sciatica, otherwise WNL  LOWER EXTREMITY MMT:  BLE MMT at least 4/5, poor NM control in RLE   BED  MOBILITY:  I  TRANSFERS: I STAIRS: Level of Assistance: Modified independence Stair Negotiation Technique: Alternating Pattern  with No Rails Patient reports this ability to climb stairs is new. He has been using step to due to fear of R LE giving Number of Stairs: 5  Height of Stairs: 6  Comments: No major deviations  GAIT: No major gait deviations noted during ambulation in clinic.   FUNCTIONAL TESTS:  5 times sit to stand: 16.08 using hands on knees to push up Functional gait assessment: 25/30 SLS 13 sec on L due to chronic sciatica, < 6 sec on R with increased instability.  VISION SCREEN, VESTIBULAR ASSESSMENT: Limited due to time, Eyes were normal in tracking, saccades, ROM. Mildly (+) VOR, (-) VOR cancellation  TODAY'S TREATMENT:                                                                                                                              DATE:   09/26/23  TherEx  Nustep L3 BLEs x6 minutes self selected pace TA set + lumbar rotation stretch 5x5 seconds B SKTC 5x5 seconds  B Figure 4 stretches 2x30 seconds B HS stretches 2x30 seconds B Lumbar extensions x10 standing Supine TA sets 15x3 seconds  Supine march + TA set x12 Alternating/opposite UE/LE flexion + TA set x10 B      09/12/23 Education    PATIENT EDUCATION: Education details: POC Person educated: Patient Education method: Explanation Education comprehension: verbalized understanding  HOME EXERCISE PROGRAM:  Access Code: [email or web address] URL: [email or web address] Date: 09/26/2023 Prepared by: Nedra Hai  Exercises - Supine Lower Trunk Rotation  - 2 x daily - 7 x weekly - 1 sets - 10 reps - Supine Single Knee to Chest Stretch  - 2 x  daily - 7 x weekly - 1 sets - 5 reps - Supine Figure 4 Piriformis Stretch  - 2 x daily - 7 x weekly - 1 sets - 2 reps - Hooklying Hamstring Stretch with Strap  - 2 x daily - 7 x weekly - 1 sets - 10 reps - Standing Lumbar Extension  - 2 x daily - 7 x weekly - 2 sets - 10 reps - 2 seconds  hold  GOALS: Goals reviewed with patient? Yes  SHORT TERM GOALS: Target date: 09/26/23  I with initial HEP Baseline: Goal status: INITIAL LONG TERM GOALS: Target date: 11/21/23  I with final HEP Baseline:  Goal status: INITIAL  2.  Patient will be able to stand on either limb in SLS x at least 30 minutes with slow movement of the opposite extremity without LOB. Baseline:  Goal status: INITIAL  3.  Improve patient's L sided LB and sciatic pain to < 4/10 with all activity Baseline: 8/10 Goal status: INITIAL  4.  Patient will walk at least 800 yards on all terrain, no AD, no unsteadiness, no fatigue. Baseline:  Goal status: INITIAL  5.  Improve FGA score to at least  28/30 Baseline: 25/30 Goal status: INITIAL  ASSESSMENT:  CLINICAL IMPRESSION:   Pt arrives today doing OK, still having flare up of sciatic pain and requests we work on back pain/sciatica this afternoon. Warmed up as tolerated on Nustep, otherwise focused on MSK interventions for lumbar pain today. Pain still easily aggravated, modified session PRN but responded well to interventions overall today. Will continue to progress as able/tolerated.    EVAL:Patient is a 52 y.o. who was seen today for physical therapy evaluation and treatment for S/P CVA. He presents with chronic LBP with L sciatica that influenced his mobility assessment as well decreased NM control of RLE, decreased balance, unsteadiness, and difficulty walking. He will benefit form PT to improve his back pain as well as facilitate improved functional strength and control in his R LE to improve balance and safety and ease of walking.  OBJECTIVE IMPAIRMENTS: Abnormal  gait, decreased activity tolerance, decreased balance, decreased cognition, decreased coordination, difficulty walking, decreased ROM, decreased strength, dizziness, improper body mechanics, and pain.   ACTIVITY LIMITATIONS: carrying, lifting, bending, squatting, stairs, and locomotion level  PARTICIPATION LIMITATIONS: meal prep, cleaning, laundry, driving, shopping, community activity, and occupation  PERSONAL FACTORS: Past/current experiences are also affecting patient's functional outcome.   REHAB POTENTIAL: Good  CLINICAL DECISION MAKING: Evolving/moderate complexity  EVALUATION COMPLEXITY: Moderate  PLAN:  PT FREQUENCY: 1x/week  PT DURATION: 10 weeks  PLANNED INTERVENTIONS: 97110-Therapeutic exercises, 97530- Therapeutic activity, 97112- Neuromuscular re-education, 97535- Self Care, 16109- Manual therapy, [id number]- Gait training, 97014- Electrical stimulation (unattended), Patient/Family education, Balance training, Stair training, Dry Needling, Joint mobilization, Spinal mobilization, Vestibular training, Visual/preceptual remediation/compensation, Cryotherapy, and Moist heat  PLAN FOR NEXT SESSION: Treatment for L sciatic and lumbar pain as appropriate (possible extension preference give pt reported hx of disc issues), motor control and coordination on R, balance. How does HEP feel?  Nedra Hai, PT, DPT 09/26/23 1:42 PM

## 2023-10-04 NOTE — Progress Notes (Signed)
[facility] [address] [place]. [place] [address] [phone]       HOSPITAL FOLLOW UP NOTE  [name] Date of Birth:  11/13/1970 Medical Record Number:  [id number]   Reason for Referral:  hospital stroke follow up    SUBJECTIVE:   CHIEF COMPLAINT:  Chief Complaint  Patient presents with   Follow-up    Pt in room 1 alone. Here for hospital follow up stroke. Pt said when standing he feel dizzy, right foot drags at times. Right index finger still numb.     HPI:   Jack Carney is a 52 y.o. who  has a past medical history of ALLERGIC RHINITIS (05/08/2007), ASTHMA (05/08/2007), and HYPERLIPIDEMIA (05/08/2007).  Patient presented on 09/07/2023 with sudden onset dizziness, disorientation, fatigue, bilateral hand numbness and right lower ext weakness. MRI showed acute infarct in the anterolateral aspect of the L thalamus just anterior to the old L thalamic perforator infarct. LDL 160. A1C 9.7. Lipitor increased to 80mg . He was started on asa and Plavix for 3 weeks then asa alone. Therapy recommended outpateint PT/OT. He was discharged home. Personally reviewed hospitalization pertinent progress notes, lab work and imaging.  Evaluated by Roda Shutters.   Since discharge, he is doing fairly well. He does have intermittent dizziness, specifically with position changes. He had cut back on caloric intake in an attempt to manage weight and CBGs. He admits that he was not eating regular meals. Over the past couple of weeks he has tried to eat healthier foods and eating more consistently. He has restarted Ozempic and PCP increased glipizide to 15mg  daily. He has continued 10mg  but reports new rx is at the pharmacy ready to be picked up. He keeps a close eye on CBGs. Recent reading was around 80. Usually 120s. He has not checked BP. He has continued amlodipine. Also taking atorvastatin and aspirin. Tolerating meds well. He drinks about 30-40 ounces of water daily.   He continues to  work with PT. He has residual numbness of right index finger.  He has chronic low back pain with sciatica. Previously followed by Dr Gerlene Fee. CT cervical spine showed multi level cervical spondylosis, worst at C3-C4 with severe left neural foraminal narrowing. He has MRI lumbar spine scheduled 10/26/2023.    PERTINENT IMAGING/LABS  Code Stroke CT head No acute abnormality.  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 No acute hemorrhage or significant mass effect. 2D Echo EF 55-60%, no intracardiac source of embolism,unremarkable     A1C Lab Results  Component Value Date   HGBA1C 9.7 (H) 09/07/2023    Lipid Panel     Component Value Date/Time   CHOL 250 (H) 09/08/2023 0459   TRIG 232 (H) 09/08/2023 0459   HDL 44 09/08/2023 0459   CHOLHDL 5.7 09/08/2023 0459   VLDL 46 (H) 09/08/2023 0459   LDLCALC 160 (H) 09/08/2023 0459   LDLDIRECT 189.0 03/02/2022 0935      ROS:   14 system review of systems performed and negative with exception of those listed in HPI  PMH:  Past Medical History:  Diagnosis Date   ALLERGIC RHINITIS 05/08/2007   Qualifier: Diagnosis of  By: Jonny Ruiz MD, Len Blalock    ASTHMA 05/08/2007   Qualifier: Diagnosis of  By: Jonny Ruiz MD, Len Blalock  HYPERLIPIDEMIA 05/08/2007   Qualifier: Diagnosis of  By: Jonny Ruiz MD, Len Blalock     PSH:  Past Surgical History:  Procedure Laterality Date   sinus surgury     TONSILLECTOMY      Social History:  Social History   Socioeconomic History   Marital status: Legally Separated    Spouse name: Not on file   Number of children: Not on file   Years of education: Not on file   Highest education level: Not on file  Occupational History   Not on file  Tobacco Use   Smoking status: Never   Smokeless tobacco: Never  Vaping Use   Vaping status: Never Used   Substance and Sexual Activity   Alcohol use: No   Drug use: No   Sexual activity: Not on file  Other Topics Concern   Not on file  Social History Narrative   Right handed    Drinks coffee 1 cup per month   Social Drivers of Health   Financial Resource Strain: Not on file  Food Insecurity: No Food Insecurity (09/11/2023)   Hunger Vital Sign    Worried About Running Out of Food in the Last Year: Never true    Ran Out of Food in the Last Year: Never true  Transportation Needs: No Transportation Needs (09/11/2023)   PRAPARE - Administrator, Civil Service (Medical): No    Lack of Transportation (Non-Medical): No  Physical Activity: Not on file  Stress: Not on file  Social Connections: Not on file  Intimate Partner Violence: Not At Risk (09/11/2023)   Humiliation, Afraid, Rape, and Kick questionnaire    Fear of Current or Ex-Partner: No    Emotionally Abused: No    Physically Abused: No    Sexually Abused: No    Family History:  Family History  Problem Relation Age of Onset   Hypertension Other     Medications:   Current Outpatient Medications on File Prior to Visit  Medication Sig Dispense Refill   albuterol (VENTOLIN HFA) 108 (90 Base) MCG/ACT inhaler Inhale 2 puffs into the lungs every 6 (six) hours as needed for wheezing or shortness of breath. 1 each 5   amLODipine (NORVASC) 5 MG tablet TAKE 1 TABLET(5 MG) BY MOUTH DAILY 90 tablet 3   aspirin EC 81 MG tablet Take 1 tablet (81 mg total) by mouth daily. Swallow whole. 30 tablet 12   atorvastatin (LIPITOR) 80 MG tablet Take 1 tablet (80 mg total) by mouth daily. 90 tablet 3   glipiZIDE (GLUCOTROL XL) 5 MG 24 hr tablet Take 3 tablets (15 mg total) by mouth daily with breakfast. Keep track of your blood sugars and follow up with your PCP to determine whether you need to further adjust your glipizide.  Watch for low blood sugars. 90 tablet 0   Semaglutide,0.25 or 0.5MG /DOS, (OZEMPIC, 0.25 OR 0.5 MG/DOSE,) 2  MG/1.5ML SOPN Inject 0.25 mg into the skin once a week. Resume your ozempic as previously prescribed.  Follow up with your PCP to discuss adjustment in dose. 6 mL 3   tamsulosin (FLOMAX) 0.4 MG CAPS capsule Take 1 capsule (0.4 mg total) by mouth daily. 90 capsule 3   No current facility-administered medications on file prior to visit.    Allergies:   Allergies  Allergen Reactions   Metformin And Related Diarrhea   Sulfonamide Derivatives Nausea And Vomiting    Stomach pains   Penicillins Rash      OBJECTIVE:  Physical  Exam  Vitals:   10/09/23 0759 10/09/23 0806 10/09/23 0807  BP:  (!) 147/103 (!) 148/89  Pulse:  83 71  Weight: 236 lb 9.6 oz (107.3 kg)    Height: 5\' 9"  (1.753 m)     Body mass index is 34.94 kg/m. No results found.     09/11/2023    3:12 PM  Depression screen PHQ 2/9  Decreased Interest 0  Down, Depressed, Hopeless 0  PHQ - 2 Score 0     General: well developed, well nourished, seated, in no evident distress Head: head normocephalic and atraumatic.   Neck: supple with no carotid or supraclavicular bruits Cardiovascular: regular rate and rhythm, no murmurs Musculoskeletal: no deformity Skin:  no rash/petichiae Vascular:  Normal pulses all extremities   Neurologic Exam Mental Status: Awake and fully alert.  Fluent speech and language.  Oriented to place and time. Recent and remote memory intact. Attention span, concentration and fund of knowledge appropriate. Mood and affect appropriate.  Cranial Nerves: Fundoscopic exam reveals sharp disc margins. Pupils equal, briskly reactive to light. Extraocular movements full without nystagmus. Visual fields full to confrontation. Hearing intact. Facial sensation intact. Face, tongue, palate moves normally and symmetrically.  Motor: Normal bulk and tone. Normal strength in all tested extremity muscles Sensory.: intact to touch , pinprick , position and vibratory sensation.  Coordination: Rapid alternating  movements normal in all extremities. Finger-to-nose and heel-to-shin performed accurately bilaterally. Gait and Station: Arises from chair without difficulty. Stance is normal. Gait demonstrates normal stride length and balance with no assistive device.   Reflexes: 1+ and symmetric.    NIHSS  0 Modified Rankin  0    ASSESSMENT: Jack Carney is a 52 y.o. year old male presenting with dizziness, bilateral hand numbness, right lower ext weakness. Vascular risk factors include HTN, HLD, DMT2, obesity.     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 Continue aspirin 81 mg daily  and atorvastatin 80mg  daily for secondary stroke prevention.  Discussed secondary stroke prevention measures and importance of close PCP follow up for aggressive stroke risk factor management. I have gone over the pathophysiology of stroke, warning signs and symptoms, risk factors and their management in some detail with instructions to go to the closest emergency room for symptoms of concern. HTN: BP goal <130/90. Elevated today. Please continue  on amlodipine 5mg  daily. Monitor intake of sodium. Continue to monitor per PCP HLD: LDL goal <70. Recent LDL 160. Continue lipitor 80mg  daily per PCP.  DMII: A1c goal<7.0. Recent A1c 9.7. Continue glipizide 15mg  daily per PCP. Please work on eating regular well balanced meals.  Obesity: Continue weight healthy management per PCP.  Low back pain: Continue with plans for MRI as scheduled. Follow up with Dr Yetta Barre pending results. Continue PT as directed.  Right index finger numbness: continue PT. Consider MRI cervical spine.  Dizziness: Please keep an eye on your BP at home. Try to increase water intake to 70-80 ounces of water daily.    Follow up as needed   CC:  [facility] provider: Dr. Pearlean Brownie PCP: Corwin Levins, MD    I spent 45 minutes of face-to-face and non-face-to-face time with patient.  This included  previsit chart review including review of recent hospitalization, lab review, study review, order entry, electronic health record documentation, patient education regarding recent stroke including etiology, secondary stroke prevention measures and importance of managing stroke risk factors, residual deficits and typical  recovery time and answered all other questions to patient satisfaction   Shawnie Dapper, [facility]  [facility] [address] [place], [place] [address]  Phone [phone] Fax [phone] Note: This document was prepared with digital dictation and possible smart phrase technology. Any transcriptional errors that result from this process are unintentional.

## 2023-10-05 ENCOUNTER — Other Ambulatory Visit: Payer: Self-pay

## 2023-10-05 ENCOUNTER — Telehealth: Payer: Self-pay | Admitting: Internal Medicine

## 2023-10-05 MED ORDER — ASPIRIN 81 MG PO TBEC: 81.0000 mg | DELAYED_RELEASE_TABLET | Freq: Every day | ORAL | 12 refills | Status: AC

## 2023-10-05 MED ORDER — AMLODIPINE BESYLATE 5 MG PO TABS: ORAL_TABLET | ORAL | 3 refills | Status: DC

## 2023-10-05 MED ORDER — ATORVASTATIN CALCIUM 80 MG PO TABS: 80.0000 mg | ORAL_TABLET | Freq: Every day | ORAL | 3 refills | Status: AC

## 2023-10-05 MED ORDER — GLIPIZIDE ER 5 MG PO TB24: 15.0000 mg | ORAL_TABLET | Freq: Every day | ORAL | 0 refills | Status: DC

## 2023-10-05 NOTE — Telephone Encounter (Signed)
Prescription Request  10/05/2023  LOV: 09/11/2023  What is the name of the medication or equipment? amLODipine (NORVASC) 5 MG tablet  aspirin EC 81 MG tablet  atorvastatin (LIPITOR) 80 MG tablet glipiZIDE (GLUCOTROL XL) 5 MG 24 hr tablet   Have you contacted your pharmacy to request a refill? Yes   Which pharmacy would you like this sent to?  [id number] [place], [place] - [phone] AT [facility] [address] [place] [address] Phone: [phone] Fax: [phone]    Patient notified that their request is being sent to the clinical staff for review and that they should receive a response within 2 business days.   Please advise at Mobile [phone] (mobile)

## 2023-10-05 NOTE — Telephone Encounter (Signed)
Refill sent.

## 2023-10-09 ENCOUNTER — Ambulatory Visit (INDEPENDENT_AMBULATORY_CARE_PROVIDER_SITE_OTHER): Payer: BC Managed Care – PPO | Admitting: Family Medicine

## 2023-10-09 ENCOUNTER — Encounter: Payer: Self-pay | Admitting: Family Medicine

## 2023-10-09 VITALS — BP 148/89 | HR 71 | Ht 69.0 in | Wt 236.6 lb

## 2023-10-09 DIAGNOSIS — I639 Cerebral infarction, unspecified: Secondary | ICD-10-CM | POA: Diagnosis not present

## 2023-10-09 NOTE — Patient Instructions (Addendum)
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 Continue aspirin 81 mg daily  and atorvastatin 80mg  daily for secondary stroke prevention.  Discussed secondary stroke prevention measures and importance of close PCP follow up for aggressive stroke risk factor management. I have gone over the pathophysiology of stroke, warning signs and symptoms, risk factors and their management in some detail with instructions to go to the closest emergency room for symptoms of concern. HTN: BP goal <130/90. Elevated today. Please continue  on amlodipine 5mg  daily. Monitor intake of sodium. Continue to monitor per PCP HLD: LDL goal <70. Recent LDL 160. Continue lipitor 80mg  daily per PCP.  DMII: A1c goal<7.0. Recent A1c 9.7. Continue glipizide 15mg  daily per PCP. Please work on eating regular well balanced meals.  Obesity: Continue weight healthy management per PCP.  Low back pain: Continue with plans for MRI as scheduled. Follow up with Dr Yetta Barre pending results. Continue PT as directed.  Right index finger numbness: continue PT. Consider MRI cervical spine.  Dizziness: Please keep an eye on your BP at home. Try to increase water intake to 70-80 ounces of water daily.   Goals:  1) Maintain strict control of hypertension with blood pressure goal below 130/90 2) Maintain good control of diabetes with hemoglobin A1c goal below 7%  3) Maintain good control of lipids with LDL cholesterol goal below 70 mg/dL.  4) Eat a healthy diet with plenty of whole grains, cereals, fruits and vegetables, exercise regularly and maintain ideal body weight   Resources: https://www.williams.biz/  Please make sure you are staying well hydrated. I recommend 50-60 ounces daily. Well balanced diet and regular exercise encouraged. Consistent sleep schedule with 6-8 hours recommended.   Please  continue follow up with care team as directed.   Follow up with me as needed   You may receive a survey regarding today's visit. I encourage you to leave honest feed back as I do use this information to improve patient care. Thank you for seeing me today!

## 2023-10-10 ENCOUNTER — Ambulatory Visit: Payer: BC Managed Care – PPO | Admitting: Physical Therapy

## 2023-10-10 ENCOUNTER — Encounter: Payer: Self-pay | Admitting: Physical Therapy

## 2023-10-10 DIAGNOSIS — I639 Cerebral infarction, unspecified: Secondary | ICD-10-CM

## 2023-10-10 DIAGNOSIS — R2681 Unsteadiness on feet: Secondary | ICD-10-CM

## 2023-10-10 DIAGNOSIS — R278 Other lack of coordination: Secondary | ICD-10-CM

## 2023-10-10 NOTE — Therapy (Signed)
OUTPATIENT PHYSICAL THERAPY NEURO TREATMENT   Patient Name: Jack Carney MRN: [id number] DOB:[birth year], 52 y.o., male Today's Date: 10/10/2023   PCP: Corwin Levins, MD  REFERRING PROVIDER: Zigmund Daniel., MD  END OF SESSION:  PT End of Session - 10/10/23 0851     Visit Number 3    Date for PT Re-Evaluation 11/21/23    PT Start Time 0847    PT Stop Time 0925    PT Time Calculation (min) 38 min              Past Medical History:  Diagnosis Date   ALLERGIC RHINITIS 05/08/2007   Qualifier: Diagnosis of  By: Jonny Ruiz MD, Len Blalock    ASTHMA 05/08/2007   Qualifier: Diagnosis of  By: Jonny Ruiz MD, Len Blalock    HYPERLIPIDEMIA 05/08/2007   Qualifier: Diagnosis of  By: Jonny Ruiz MD, Len Blalock    Past Surgical History:  Procedure Laterality Date   sinus surgury     TONSILLECTOMY     Patient Active Problem List   Diagnosis Date Noted   Left lumbar radiculopathy 09/11/2023   History of completed stroke 09/11/2023   Acute CVA (cerebrovascular accident) (HCC) 09/07/2023   Type 2 diabetes mellitus with hyperglycemia (HCC) 09/07/2023   Obesity (BMI 35.0-39.9 without comorbidity) 03/02/2022   Diabetes (HCC) 03/17/2021   Vitamin D deficiency 12/13/2020   Acute right flank pain 10/16/2019   Other microscopic hematuria 10/16/2019   Kidney stone on right side 10/16/2019   Acute pyelonephritis 12/24/2018   Essential hypertension 12/24/2018   Ventricular bigeminy seen on cardiac monitor    Encounter for well adult exam with abnormal findings 03/07/2013   HYPERSOMNIA 10/16/2007   Hyperlipidemia 05/08/2007   Seasonal and perennial allergic rhinitis 05/08/2007   Asthma 05/08/2007    ONSET DATE: 09/08/23  REFERRING DIAG:  R42 (ICD-10-CM) - Dizziness  I63.9 (ICD-10-CM) - Cerebrovascular accident (CVA), unspecified mechanism (HCC)    THERAPY DIAG:  Cerebrovascular accident (CVA), unspecified mechanism (HCC)  Unsteadiness on feet  Other lack of coordination  Rationale for  Evaluation and Treatment: Rehabilitation  SUBJECTIVE:                                                                                                                                                                                             SUBJECTIVE STATEMENT:  10/10/23 Patient reports that his back pain. He still feels clumsy with his R fingers.  EVAL: Patient reports a CVA on 09/07/23. He stayed overnight. Both hands were numb and had some difficulty with RLE as well as dizziness. Yesterday he got antsy and active at home.  He overdid it and was exhausted. His blood sugar and BP were somewhat high. He still gets dizzy when moving too fast, turning or moving from quadruped to stand. His hands are better, but his R index finger is still numb. R leg is feeling stronger, but not feeling normal. Felt like it would give out up until today. He also has chronic LBP and L LE sciatica. Pt accompanied by: self  PERTINENT HISTORY:  Per acute hospital PT evaluation:  52 y.o. male presented to ED 11/14 with dizziness after sneezing. Also experiencing disorientation, fatigue, numbness in B hands and RLE weakness. 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 MRI of the cervical spine showing multilevel cervical spondylosis, worst at C3-C4, where there is severe left neuroforaminal narrowing. Moderate to severe right neuroforaminal narrowing C5-C6. PMH: acute pyelonephritis, nephrolithiasis, ventricular bigeminy, vitamin D deficiency, class II obesity, type 2 diabetes, hyperlipidemia, hypertension, allergic rhinitis, asthma   PAIN:  Are you having pain? Yes: NPRS scale: back 8.5, knees 7.5/10 Pain location: Chronic sciatica on L side, B knee pain Pain description: sciatic pain, shooting/numb going down leg Aggravating factors: sitting Relieving factors: lying down AS evaluation progressed paitent's sciatic pain  increased, unable to sit straight or sit still. The pain impeded mobility.   PRECAUTIONS: Fall  RED FLAGS: None   WEIGHT BEARING RESTRICTIONS: No  FALLS: Has patient fallen in last 6 months? Yes. Number of falls 1 2 weeks prior to his stroke he was taking out garbage cans in the dark, missed the last step, landing on his knees. His pain improved, but returned the day of his stroke.  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   OBJECTIVE:  Note: Objective measures were completed at Evaluation unless otherwise noted.  DIAGNOSTIC FINDINGS:  MRI with acute infarct in the anterolateral aspect of the L thalamus just anterior to the old L thalamic perforator infarct CTA head/neck without significant proximal stenosis, aneurysm, or branch vessel occlusion within circle of willis, normal variant CTA circle of willis without significant proximal stenosis, aneurysm or branch vessel occlusion.  COGNITION: Overall cognitive status: Within functional limits for tasks assessed   SENSATION: Light touch: Impaired  Finger to nose- slow on R, heel slide on shin WNL. LT impaired in R U and LE, spotty.  COORDINATION: Heel slide WFL B, FTN, slow on R  EDEMA:  N/A  MUSCLE TONE: WNL  POSTURE: rounded shoulders and forward head  LOWER EXTREMITY ROM:   L hip ROM limited due to pain from chronic sciatica, otherwise WNL  LOWER EXTREMITY MMT:  BLE MMT at least 4/5, poor NM control in RLE   BED MOBILITY:  I  TRANSFERS: I STAIRS: Level of Assistance: Modified independence Stair Negotiation Technique: Alternating Pattern  with No Rails Patient reports this ability to climb stairs is new. He has been using step to due to fear of R LE  giving Number of Stairs: 5  Height of Stairs: 6  Comments: No major deviations  GAIT: No major gait deviations noted during ambulation in clinic.   FUNCTIONAL TESTS:  5 times sit to stand: 16.08 using hands on knees to push up Functional gait assessment: 25/30 SLS 13  sec on L due to chronic sciatica, < 6 sec on R with increased instability.  VISION SCREEN, VESTIBULAR ASSESSMENT: Limited due to time, Eyes were normal in tracking, saccades, ROM. Mildly (+) VOR, (-) VOR cancellation  TODAY'S TREATMENT:                                                                                                                              DATE:  10/10/23 Bike L3 x 6 minutes STM with tennis ball to L piriformis F/B stretch Self deep tissue mobs with tennis ball Seated figure 4 stretch to piriformis Seated HS stretch, Standing ITB stretch at wall VOR re-assessment, (-) sitting and standing Leg press, 60#, BLE 2 x 15 reps Step ups, 6" forward and side steps x 10 each leg  09/26/23 TherEx  Nustep L3 BLEs x6 minutes self selected pace TA set + lumbar rotation stretch 5x5 seconds B SKTC 5x5 seconds  B Figure 4 stretches 2x30 seconds B HS stretches 2x30 seconds B Lumbar extensions x10 standing Supine TA sets 15x3 seconds  Supine march + TA set x12 Alternating/opposite UE/LE flexion + TA set x10 B  09/12/23 Education    PATIENT EDUCATION: Education details: POC Person educated: Patient Education method: Explanation Education comprehension: verbalized understanding  HOME EXERCISE PROGRAM:  Access Code: [email or web address] URL: [email or web address] Date: 09/26/2023 Prepared by: Nedra Hai  Exercises - Supine Lower Trunk Rotation  - 2 x daily - 7 x weekly - 1 sets - 10 reps - Supine Single Knee to Chest Stretch  - 2 x daily - 7 x weekly - 1 sets - 5 reps - Supine Figure 4 Piriformis Stretch  - 2 x daily - 7 x weekly - 1 sets - 2 reps - Hooklying Hamstring Stretch with Strap  -  2 x daily - 7 x weekly - 1 sets - 10 reps - Standing Lumbar Extension  - 2 x daily - 7 x weekly - 2 sets - 10 reps - 2 seconds  hold  GOALS: Goals reviewed with patient? Yes  SHORT TERM GOALS: Target date: 09/26/23  I with initial HEP Baseline: Goal status: INITIAL LONG TERM GOALS: Target date: 11/21/23  I with final HEP Baseline:  Goal status: INITIAL  2.  Patient will be able to stand on either limb in SLS x at least 30 seconds with slow movement of the opposite extremity without LOB. Baseline:  Goal status: INITIAL  3.  Improve patient's L sided LB and sciatic pain to < 4/10 with all activity Baseline: 8/10 Goal status: 10/10/23- improving, but still present ongoing  4.  Patient will walk at least 800 yards on all terrain, no AD, no unsteadiness, no fatigue. Baseline:  Goal status: INITIAL  5.  Improve FGA score to at least 28/30 Baseline: 25/30 Goal status: INITIAL  ASSESSMENT:  CLINICAL IMPRESSION: Patient reports that all of his symptoms remain including pain in hip and L leg and numbness in R fingers.  Treatment focused on addressing his back pain as he is leaving tomorrow for a 3 hour flight. Provided him with some techniques to relieve tension in the hip and back. Then returned to rehab, performing some LE strengthening as he reports that his leg fatigues with walking. He reported relief from the pain with the treatment activities.  EVAL:Patient is a 52 y.o. who was seen today for physical therapy evaluation and treatment for S/P CVA. He presents with chronic LBP with L sciatica that influenced his mobility assessment as well decreased NM control of RLE, decreased balance, unsteadiness, and difficulty walking. He will benefit form PT to improve his back pain as well as facilitate improved functional strength and control in his R LE to improve balance and safety and ease of walking.  OBJECTIVE IMPAIRMENTS: Abnormal gait, decreased activity tolerance, decreased balance,  decreased cognition, decreased coordination, difficulty walking, decreased ROM, decreased strength, dizziness, improper body mechanics, and pain.   ACTIVITY LIMITATIONS: carrying, lifting, bending, squatting, stairs, and locomotion level  PARTICIPATION LIMITATIONS: meal prep, cleaning, laundry, driving, shopping, community activity, and occupation  PERSONAL FACTORS: Past/current experiences are also affecting patient's functional outcome.   REHAB POTENTIAL: Good  CLINICAL DECISION MAKING: Evolving/moderate complexity  EVALUATION COMPLEXITY: Moderate  PLAN:  PT FREQUENCY: 1x/week  PT DURATION: 10 weeks  PLANNED INTERVENTIONS: 97110-Therapeutic exercises, 97530- Therapeutic activity, 97112- Neuromuscular re-education, 97535- Self Care, 10272- Manual therapy, [phone]- Gait training, 97014- Electrical stimulation (unattended), Patient/Family education, Balance training, Stair training, Dry Needling, Joint mobilization, Spinal mobilization, Vestibular training, Visual/preceptual remediation/compensation, Cryotherapy, and Moist heat  PLAN FOR NEXT SESSION: Add step ups to HEP  Oley Balm DPT 10/10/23 1:07 PM

## 2023-10-20 ENCOUNTER — Ambulatory Visit: Payer: BC Managed Care – PPO

## 2023-10-20 ENCOUNTER — Other Ambulatory Visit: Payer: Self-pay

## 2023-10-20 DIAGNOSIS — I639 Cerebral infarction, unspecified: Secondary | ICD-10-CM

## 2023-10-20 DIAGNOSIS — R2681 Unsteadiness on feet: Secondary | ICD-10-CM

## 2023-10-20 DIAGNOSIS — R278 Other lack of coordination: Secondary | ICD-10-CM

## 2023-10-20 NOTE — Therapy (Signed)
OUTPATIENT PHYSICAL THERAPY NEURO TREATMENT   Patient Name: Jack Carney MRN: [id number] DOB:Apr 20, 1971, 52 y.o., male Today's Date: 10/20/2023   PCP: Corwin Levins, MD  REFERRING PROVIDER: Zigmund Daniel., MD  END OF SESSION:     Past Medical History:  Diagnosis Date   ALLERGIC RHINITIS 05/08/2007   Qualifier: Diagnosis of  By: Jonny Ruiz MD, Len Blalock    ASTHMA 05/08/2007   Qualifier: Diagnosis of  By: Jonny Ruiz MD, Len Blalock    HYPERLIPIDEMIA 05/08/2007   Qualifier: Diagnosis of  By: Jonny Ruiz MD, Len Blalock    Past Surgical History:  Procedure Laterality Date   sinus surgury     TONSILLECTOMY     Patient Active Problem List   Diagnosis Date Noted   Left lumbar radiculopathy 09/11/2023   History of completed stroke 09/11/2023   Acute CVA (cerebrovascular accident) (HCC) 09/07/2023   Type 2 diabetes mellitus with hyperglycemia (HCC) 09/07/2023   Obesity (BMI 35.0-39.9 without comorbidity) 03/02/2022   Diabetes (HCC) 03/17/2021   Vitamin D deficiency 12/13/2020   Acute right flank pain 10/16/2019   Other microscopic hematuria 10/16/2019   Kidney stone on right side 10/16/2019   Acute pyelonephritis 12/24/2018   Essential hypertension 12/24/2018   Ventricular bigeminy seen on cardiac monitor    Encounter for well adult exam with abnormal findings 03/07/2013   HYPERSOMNIA 10/16/2007   Hyperlipidemia 05/08/2007   Seasonal and perennial allergic rhinitis 05/08/2007   Asthma 05/08/2007    ONSET DATE: 09/08/23  REFERRING DIAG:  R42 (ICD-10-CM) - Dizziness  I63.9 (ICD-10-CM) - Cerebrovascular accident (CVA), unspecified mechanism (HCC)    THERAPY DIAG:  No diagnosis found.  Rationale for Evaluation and Treatment: Rehabilitation  SUBJECTIVE:                                                                                                                                                                                             SUBJECTIVE STATEMENT  10/20/23 Patient  reports that his L hip hurts, back to driving, R index finger still numb. EVAL: Patient reports a CVA on 09/07/23. He stayed overnight. Both hands were numb and had some difficulty with RLE as well as dizziness. Yesterday he got antsy and active at home. He overdid it and was exhausted. His blood sugar and BP were somewhat high. He still gets dizzy when moving too fast, turning or moving from quadruped to stand. His hands are better, but his R index finger is still numb. R leg is feeling stronger, but not feeling normal. Felt like it would give out up until today. He also has chronic LBP and L LE  sciatica. Pt accompanied by: self  PERTINENT HISTORY:  Per acute hospital PT evaluation:  52 y.o. male presented to ED 11/14 with dizziness after sneezing. Also experiencing disorientation, fatigue, numbness in B hands and RLE weakness. 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 MRI of the cervical spine showing multilevel cervical spondylosis, worst at C3-C4, where there is severe left neuroforaminal narrowing. Moderate to severe right neuroforaminal narrowing C5-C6. PMH: acute pyelonephritis, nephrolithiasis, ventricular bigeminy, vitamin D deficiency, class II obesity, type 2 diabetes, hyperlipidemia, hypertension, allergic rhinitis, asthma   PAIN:  Are you having pain? Yes: NPRS scale: back 8.5, knees 7.5/10 Pain location: Chronic sciatica on L side, B knee pain Pain description: sciatic pain, shooting/numb going down leg Aggravating factors: sitting Relieving factors: lying down AS evaluation progressed paitent's sciatic pain increased, unable to sit straight or sit still. The pain impeded mobility.   PRECAUTIONS: Fall  RED FLAGS: None   WEIGHT BEARING RESTRICTIONS: No  FALLS: Has patient fallen in last 6 months? Yes. Number of falls 1 2 weeks prior to his stroke he was taking out garbage cans in the  dark, missed the last step, landing on his knees. His pain improved, but returned the day of his stroke.  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   OBJECTIVE:  Note: Objective measures were completed at Evaluation unless otherwise noted.  DIAGNOSTIC FINDINGS:  MRI with acute infarct in the anterolateral aspect of the L thalamus just anterior to the old L thalamic perforator infarct CTA head/neck without significant proximal stenosis, aneurysm, or branch vessel occlusion within circle of willis, normal variant CTA circle of willis without significant proximal stenosis, aneurysm or branch vessel occlusion.  COGNITION: Overall cognitive status: Within functional limits for tasks assessed   SENSATION: Light touch: Impaired  Finger to nose- slow on R, heel slide on shin WNL. LT impaired in R U and LE, spotty.  COORDINATION: Heel slide WFL B, FTN, slow on R  EDEMA:  N/A  MUSCLE TONE: WNL  POSTURE: rounded shoulders and forward head  LOWER EXTREMITY ROM:   L hip ROM limited due to pain from chronic sciatica, otherwise WNL  LOWER EXTREMITY MMT:  BLE MMT at least 4/5, poor NM control in RLE   BED MOBILITY:  I  TRANSFERS: I STAIRS: Level of Assistance: Modified independence Stair Negotiation Technique: Alternating Pattern  with No Rails Patient reports this ability to climb stairs is new. He has been using step to due to fear of R LE giving Number of Stairs: 5  Height of Stairs: 6  Comments: No major deviations  GAIT: No major gait deviations noted during ambulation in clinic.   FUNCTIONAL TESTS:  5 times sit to stand: 16.08 using hands on knees to push up Functional gait assessment: 25/30 SLS 13 sec on L  due to chronic sciatica, < 6 sec on R with increased instability.  VISION SCREEN, VESTIBULAR ASSESSMENT: Limited due to time, Eyes were normal in tracking, saccades, ROM. Mildly (+) VOR, (-) VOR cancellation  TODAY'S TREATMENT:  DATE:  10/20/23: Leg press 60# 30 reps Recumbent bicycle, L 2.6 x 5 min Side lying L for deep cross friction massage L gluteals, with theragun Supine for manual assisted stretching, L hamstrings, L piriformis, L lateral/post hip musculature Reviewed and re instructed in tailor sitting with L foot on R knee for piriformis stretch Lateral step ups onto 6" step x 5, then forward step ups L with R knee drivers 15 x Dead lifts with 10# 15 reps, cues for correct technique, depth of movement Standing with arms in T position for unilateral standing dead lift to train balance.  12-Oct-2023 Bike L3 x 6 minutes STM with tennis ball to L piriformis F/B stretch Self deep tissue mobs with tennis ball Seated figure 4 stretch to piriformis Seated HS stretch, Standing ITB stretch at wall VOR re-assessment, (-) sitting and standing Leg press, 60#, BLE 2 x 15 reps Step ups, 6" forward and side steps x 10 each leg  09/26/23 TherEx  Nustep L3 BLEs x6 minutes self selected pace TA set + lumbar rotation stretch 5x5 seconds B SKTC 5x5 seconds  B Figure 4 stretches 2x30 seconds B HS stretches 2x30 seconds B Lumbar extensions x10 standing Supine TA sets 15x3 seconds  Supine march + TA set x12 Alternating/opposite UE/LE flexion + TA set x10 B  09/12/23 Education    PATIENT EDUCATION: Education details: POC Person educated: Patient Education method: Explanation Education comprehension: verbalized understanding  HOME EXERCISE PROGRAM:  Access Code: [email or web address] URL: [email or web address] Date: 09/26/2023 Prepared by: Nedra Hai  Exercises - Supine Lower Trunk Rotation  - 2 x daily - 7 x weekly - 1 sets - 10 reps - Supine Single Knee to Chest Stretch  - 2 x daily - 7 x weekly - 1 sets - 5 reps - Supine Figure 4 Piriformis Stretch  - 2 x daily - 7 x weekly - 1 sets - 2 reps - Hooklying Hamstring Stretch with Strap  - 2 x daily - 7 x weekly - 1 sets - 10 reps - Standing Lumbar Extension  - 2 x daily - 7 x weekly - 2 sets - 10 reps - 2 seconds  hold  GOALS: Goals reviewed with patient? Yes  SHORT TERM GOALS: Target date: 09/26/23  I with initial HEP Baseline: Goal status: INITIAL LONG TERM GOALS: Target date: 11/21/23  I with final HEP Baseline:  Goal status: INITIAL  2.  Patient will be able to stand on either limb in SLS x at least 30 seconds with slow movement of the opposite extremity without LOB. Baseline:  Goal status: INITIAL  3.  Improve patient's L sided LB and sciatic pain to < 4/10 with all activity Baseline: 8/10 Goal status: 10-12-23- improving, but still present ongoing  4.  Patient will walk at least 800 yards on all terrain, no AD, no unsteadiness, no fatigue. Baseline:  Goal status: INITIAL  5.  Improve FGA score to at least 28/30 Baseline: 25/30 Goal status: INITIAL  ASSESSMENT:  CLINICAL IMPRESSION: Patient reports that L post hip still very painful, particularly after flying.  Also overall fatiguing easily.  Progressed with gluteal strengthening on L.  He reported relief from the pain with the treatment activities.  EVAL:Patient is a 52 y.o. who was seen today for physical therapy evaluation and treatment for S/P CVA. He presents with chronic LBP with L sciatica that influenced his mobility assessment as well decreased NM control of RLE, decreased balance, unsteadiness, and difficulty walking. He will benefit form  PT to improve his back pain as well as facilitate improved functional strength and control in his R LE to improve balance and safety and ease of  walking.  OBJECTIVE IMPAIRMENTS: Abnormal gait, decreased activity tolerance, decreased balance, decreased cognition, decreased coordination, difficulty walking, decreased ROM, decreased strength, dizziness, improper body mechanics, and pain.   ACTIVITY LIMITATIONS: carrying, lifting, bending, squatting, stairs, and locomotion level  PARTICIPATION LIMITATIONS: meal prep, cleaning, laundry, driving, shopping, community activity, and occupation  PERSONAL FACTORS: Past/current experiences are also affecting patient's functional outcome.   REHAB POTENTIAL: Good  CLINICAL DECISION MAKING: Evolving/moderate complexity  EVALUATION COMPLEXITY: Moderate  PLAN:  PT FREQUENCY: 1x/week  PT DURATION: 10 weeks  PLANNED INTERVENTIONS: 97110-Therapeutic exercises, 97530- Therapeutic activity, 97112- Neuromuscular re-education, 97535- Self Care, 95621- Manual therapy, [phone]- Gait training, [phone]- Electrical stimulation (unattended), Patient/Family education, Balance training, Stair training, Dry Needling, Joint mobilization, Spinal mobilization, Vestibular training, Visual/preceptual remediation/compensation, Cryotherapy, and Moist heat  PLAN FOR NEXT SESSION: continue with emphasis on posterior chain strenghening  Sade Hollon, PT,DPT, OCS 10/20/23 11:25 AM

## 2023-10-26 ENCOUNTER — Other Ambulatory Visit: Payer: Self-pay | Admitting: Internal Medicine

## 2023-10-26 ENCOUNTER — Ambulatory Visit
Admission: RE | Admit: 2023-10-26 | Discharge: 2023-10-26 | Disposition: A | Discharge status: Home / Self Care | Payer: 59 | Source: Ambulatory Visit | Attending: Internal Medicine | Admitting: Internal Medicine

## 2023-10-26 DIAGNOSIS — M5416 Radiculopathy, lumbar region: Secondary | ICD-10-CM

## 2023-11-17 ENCOUNTER — Other Ambulatory Visit: Payer: Self-pay | Admitting: Internal Medicine

## 2023-11-20 ENCOUNTER — Other Ambulatory Visit: Payer: Self-pay

## 2023-12-05 ENCOUNTER — Other Ambulatory Visit: Payer: Self-pay | Admitting: Internal Medicine

## 2024-01-03 ENCOUNTER — Other Ambulatory Visit: Payer: Self-pay | Admitting: Internal Medicine

## 2024-01-04 ENCOUNTER — Other Ambulatory Visit: Payer: Self-pay

## 2024-02-13 ENCOUNTER — Other Ambulatory Visit: Payer: Self-pay

## 2024-02-13 ENCOUNTER — Other Ambulatory Visit: Payer: Self-pay | Admitting: Internal Medicine

## 2024-02-13 MED ORDER — OZEMPIC (0.25 OR 0.5 MG/DOSE) 2 MG/1.5ML ~~LOC~~ SOPN: 0.2500 mg | PEN_INJECTOR | SUBCUTANEOUS | 3 refills | Status: AC

## 2024-02-13 MED ORDER — GLIPIZIDE ER 5 MG PO TB24: 5.0000 mg | ORAL_TABLET | Freq: Every day | ORAL | 0 refills | Status: DC

## 2024-02-13 NOTE — Telephone Encounter (Signed)
 patient is also requesting lancets and test strips ACCUCHECK

## 2024-02-13 NOTE — Telephone Encounter (Signed)
 Copied from CRM [id number]. Topic: Clinical - Medication Refill >> Feb 13, 2024  1:17 PM Marlan Silva wrote: Most Recent Primary Care Visit:  Provider: Roslyn Coombe  Department: [facility]  Visit Type: SAME DAY  Date: 09/11/2023  Medication: glipiZIDE  (GLUCOTROL  XL) 5 MG 24 hr tablet, Semaglutide ,0.25 or 0.5MG /DOS, (OZEMPIC , 0.25 OR 0.5 MG/DOSE,) 2 MG/1.5ML SOPN, patient is also requesting lancets and test strips ACCUCHECK  Has the patient contacted their pharmacy? Yes (Agent: If no, request that the patient contact the pharmacy for the refill. If patient does not wish to contact the pharmacy document the reason why and proceed with request.) (Agent: If yes, when and what did the pharmacy advise?)  Is this the correct pharmacy for this prescription? Yes If no, delete pharmacy and type the correct one.  This is the patient's preferred pharmacy:  [id number] [place], [place] - [phone] AT [facility] OF [facility] & [address] [place] [address] Phone: [phone] Fax: [phone]   Has the prescription been filled recently? No  Is the patient out of the medication? Yes  Has the patient been seen for an appointment in the last year OR does the patient have an upcoming appointment? Yes  Can we respond through MyChart? Yes  Agent: Please be advised that Rx refills may take up to 3 business days. We ask that you follow-up with your pharmacy.

## 2024-03-15 ENCOUNTER — Other Ambulatory Visit: Payer: Self-pay | Admitting: Internal Medicine

## 2024-03-19 ENCOUNTER — Other Ambulatory Visit: Payer: Self-pay

## 2024-04-15 ENCOUNTER — Telehealth: Payer: Self-pay | Admitting: Internal Medicine

## 2024-04-15 DIAGNOSIS — M5416 Radiculopathy, lumbar region: Secondary | ICD-10-CM

## 2024-04-15 NOTE — Telephone Encounter (Signed)
 Ok this is done with referral to [facility] - done

## 2024-04-15 NOTE — Telephone Encounter (Signed)
 Copied from CRM [id number]. Topic: Referral - Request for Referral >> Apr 15, 2024  8:41 AM Rosina BIRCH wrote: Did the patient discuss referral with their provider in the last year? Yes (If No - schedule appointment) (If Yes - send message)  Appointment offered? Yes  Type of order/referral and detailed reason for visit: hernia disk  Preference of office, provider, location: he does not know where it want to go he just want a good specialist  If referral order, have you been seen by this specialty before? No (If Yes, this issue or another issue? When? Where?  Can we respond through MyChart? Yes CB [phone]

## 2024-04-20 ENCOUNTER — Other Ambulatory Visit: Payer: Self-pay | Admitting: Internal Medicine

## 2024-04-22 ENCOUNTER — Other Ambulatory Visit: Payer: Self-pay

## 2024-07-12 ENCOUNTER — Other Ambulatory Visit: Payer: Self-pay | Admitting: Internal Medicine

## 2024-07-12 NOTE — Telephone Encounter (Signed)
 Copied from CRM #[id number]. Topic: Clinical - Medication Refill >> Jul 12, 2024  4:44 PM Donee H wrote: Medication:  glipiZIDE  (GLUCOTROL  XL) 5 MG 24 hr tablet [[id number] amLODipine  (NORVASC ) 5 MG tablet  Has the patient contacted their pharmacy? Yes, and was told to reach out provider  This is the patient's preferred pharmacy:  [facility] #[id number] [place], [place] - [address] AT [facility] OF [facility] & [address] [place] [address] Phone: [phone] Fax: [phone]   Is this the correct pharmacy for this prescription? Yes .   Has the prescription been filled recently? No  Is the patient out of the medication? Yes, patient states completely out of medication and need refill as soon as possible   Has the patient been seen for an appointment in the last year OR does the patient have an upcoming appointment? Yes  Can we respond through MyChart? Yes  Agent: Please be advised that Rx refills may take up to 3 business days. We ask that you follow-up with your pharmacy.

## 2024-07-15 MED ORDER — AMLODIPINE BESYLATE 5 MG PO TABS: ORAL_TABLET | ORAL | 3 refills | Status: AC

## 2024-08-13 ENCOUNTER — Other Ambulatory Visit: Payer: Self-pay | Admitting: Internal Medicine

## 2024-08-13 ENCOUNTER — Other Ambulatory Visit: Payer: Self-pay

## 2024-09-09 ENCOUNTER — Telehealth: Payer: Self-pay

## 2024-09-09 NOTE — Telephone Encounter (Signed)
 Copied from CRM #[id number]. Topic: Referral - Question >> Sep 09, 2024  2:12 PM Ashley R wrote: Reason for CRM: referred to nuerology. Needs and offered a lumbar epidural - but requires more therapy. Went to chiropractor for 8 weeks, rec'd pain blocker shot but it was not sufficient - wants to know if this counts towards therapy requirement as PT was too expensive and needs to get back to work.  Callback [phone].

## 2024-09-09 NOTE — Telephone Encounter (Signed)
 Oh no, sorry but treatment per chiropracter is just similar enough to actual Physical Therapy, so I could not say that they are equivalent.

## 2024-09-21 ENCOUNTER — Other Ambulatory Visit (HOSPITAL_COMMUNITY): Payer: Self-pay

## 2024-09-27 NOTE — Telephone Encounter (Signed)
 Tried to call pt in regards to PCP response as follows Oh no, sorry but treatment per chiropracter is just similar enough to actual Physical Therapy, so I could not say that they are equivalent.  Please inform pt of the above upon his call back to the clinic.

## 2024-10-07 ENCOUNTER — Other Ambulatory Visit: Payer: Self-pay | Admitting: Internal Medicine
